# Patient Record
Sex: Female | Born: 1990 | Race: Black or African American | Hispanic: No | Smoking: Former smoker
Health system: Southern US, Community
[De-identification: ages and names within clinical notes are randomized; demographics above are authoritative.]

## PROBLEM LIST (undated history)

## (undated) DIAGNOSIS — C801 Malignant (primary) neoplasm, unspecified: Secondary | ICD-10-CM

## (undated) DIAGNOSIS — I2699 Other pulmonary embolism without acute cor pulmonale: Secondary | ICD-10-CM

## (undated) DIAGNOSIS — R002 Palpitations: Secondary | ICD-10-CM

## (undated) DIAGNOSIS — I82409 Acute embolism and thrombosis of unspecified deep veins of unspecified lower extremity: Secondary | ICD-10-CM

## (undated) DIAGNOSIS — F319 Bipolar disorder, unspecified: Secondary | ICD-10-CM

## (undated) DIAGNOSIS — K219 Gastro-esophageal reflux disease without esophagitis: Secondary | ICD-10-CM

## (undated) DIAGNOSIS — R519 Headache, unspecified: Secondary | ICD-10-CM

## (undated) DIAGNOSIS — F32A Depression, unspecified: Secondary | ICD-10-CM

## (undated) DIAGNOSIS — D649 Anemia, unspecified: Secondary | ICD-10-CM

## (undated) DIAGNOSIS — R443 Hallucinations, unspecified: Secondary | ICD-10-CM

## (undated) DIAGNOSIS — E282 Polycystic ovarian syndrome: Secondary | ICD-10-CM

## (undated) DIAGNOSIS — F419 Anxiety disorder, unspecified: Secondary | ICD-10-CM

## (undated) DIAGNOSIS — J189 Pneumonia, unspecified organism: Secondary | ICD-10-CM

## (undated) DIAGNOSIS — R55 Syncope and collapse: Secondary | ICD-10-CM

## (undated) HISTORY — PX: TONSILLECTOMY: SUR1361

## (undated) HISTORY — DX: Bipolar disorder, unspecified: F31.9

## (undated) HISTORY — PX: TONSILLECTOMY AND ADENOIDECTOMY: SHX28

## (undated) HISTORY — DX: Polycystic ovarian syndrome: E28.2

## (undated) HISTORY — PX: BARIATRIC SURGERY: SHX1103

---
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
## 2010-03-12 ENCOUNTER — Emergency Department: Admit: 2010-03-12 | Payer: Self-pay | Source: Emergency Department | Admitting: Emergency Medical Services

## 2010-03-12 LAB — BASIC METABOLIC PANEL
Anion Gap: 10 (ref 5.0–15.0)
BUN: 9 mg/dL (ref 7–21)
CO2: 26 mEq/L (ref 22–31)
Calcium: 8.9 mg/dL (ref 8.6–10.2)
Chloride: 107 mEq/L (ref 98–107)
Creatinine: 0.8 mg/dL (ref 0.5–1.4)
Glucose: 79 mg/dL (ref 70–100)
Potassium: 3.9 mEq/L (ref 3.6–5.0)
Sodium: 143 mEq/L (ref 136–143)

## 2010-03-12 LAB — CBC AND DIFFERENTIAL
Baso(Absolute): 0.02 10*3/uL (ref 0.00–0.20)
Basophils: 0 % (ref 0–2)
Eosinophils Absolute: 0.03 10*3/uL (ref 0.00–0.70)
Eosinophils: 1 % (ref 0–5)
Hematocrit: 37.2 % (ref 37.0–47.0)
Hgb: 11.9 g/dL — ABNORMAL LOW (ref 12.0–16.0)
Lymphocytes Absolute: 2.48 10*3/uL (ref 0.50–4.40)
Lymphocytes: 52 % — ABNORMAL HIGH (ref 15–41)
MCH: 25.4 pg — ABNORMAL LOW (ref 28.0–32.0)
MCHC: 32 g/dL (ref 32.0–36.0)
MCV: 79.5 fL — ABNORMAL LOW (ref 80.0–100.0)
MPV: 9.6 fL (ref 9.4–12.3)
Monocytes Absolute: 0.33 10*3/uL (ref 0.00–1.20)
Monocytes: 7 % (ref 0–11)
Neutrophils Absolute: 1.87 10*3/uL
Neutrophils: 40 % — ABNORMAL LOW (ref 52–75)
Platelets: 313 10*3/uL (ref 140–400)
RBC: 4.68 10*6/uL (ref 4.20–5.40)
RDW: 16 % — ABNORMAL HIGH (ref 12–15)
WBC: 4.73 10*3/uL (ref 3.50–10.80)

## 2010-03-12 LAB — URINALYSIS, REFLEX TO MICROSCOPIC EXAM IF INDICATED
Bilirubin, UA: NEGATIVE
Glucose, UA: NEGATIVE
Ketones UA: NEGATIVE
Leukocyte Esterase, UA: NEGATIVE
Nitrite, UA: NEGATIVE
Specific Gravity UA POCT: 1.022 (ref 1.001–1.035)
Urine pH: 7 (ref 5.0–8.0)
Urobilinogen, UA: 2 mg/dL

## 2010-03-12 LAB — HEPATIC FUNCTION PANEL
ALT: 35 U/L (ref 7–56)
AST (SGOT): 26 U/L (ref 5–40)
Albumin/Globulin Ratio: 1.1 (ref 1.1–1.8)
Albumin: 3.8 g/dL (ref 3.7–5.1)
Alkaline Phosphatase: 77 U/L (ref 43–122)
Bilirubin Direct: 0.2 mg/dL (ref 0.0–0.3)
Bilirubin Indirect: 0.1 mg/dL (ref 0.0–1.1)
Bilirubin, Total: 0.3 mg/dL (ref 0.2–1.3)
Globulin: 3.4 g/dL (ref 2.0–3.7)
Protein, Total: 7.2 g/dL (ref 6.0–8.0)

## 2010-03-12 LAB — HCG, SERUM, QUALITATIVE: Hcg Qualitative: NEGATIVE

## 2010-03-12 LAB — GFR: EGFR: >60

## 2010-03-12 LAB — LIPASE: Lipase: 86 U/L (ref 23–300)

## 2010-07-24 ENCOUNTER — Inpatient Hospital Stay
Admission: EM | Admit: 2010-07-24 | Disposition: A | Discharge status: Home / Self Care | Payer: Self-pay | Source: Emergency Department | Admitting: Pulmonary Disease

## 2010-07-24 LAB — CBC AND DIFFERENTIAL
Baso(Absolute): 0.01 10*3/uL (ref 0.00–0.20)
Basophils: 0 % (ref 0–2)
Eosinophils Absolute: 0.01 10*3/uL (ref 0.00–0.70)
Eosinophils: 0 % (ref 0–5)
Hematocrit: 38.9 % (ref 37.0–47.0)
Hgb: 12.7 g/dL (ref 12.0–16.0)
Immature Granulocytes Absolute: 0.02 10*3/uL
Immature Granulocytes: 0 % (ref 0–1)
Lymphocytes Absolute: 1.52 10*3/uL (ref 0.50–4.40)
Lymphocytes: 16 % (ref 15–41)
MCH: 25.1 pg — ABNORMAL LOW (ref 28.0–32.0)
MCHC: 32.6 g/dL (ref 32.0–36.0)
MCV: 76.9 fL — ABNORMAL LOW (ref 80.0–100.0)
MPV: 9.1 fL — ABNORMAL LOW (ref 9.4–12.3)
Monocytes Absolute: 0.49 10*3/uL (ref 0.00–1.20)
Monocytes: 5 % (ref 0–11)
Neutrophils Absolute: 7.39 10*3/uL
Neutrophils: 78 % — ABNORMAL HIGH (ref 52–75)
Platelets: 317 10*3/uL (ref 140–400)
RBC: 5.06 10*6/uL (ref 4.20–5.40)
RDW: 15 % (ref 12–15)
WBC: 9.42 10*3/uL (ref 3.50–10.80)

## 2010-07-24 LAB — BASIC METABOLIC PANEL
Anion Gap: 13 (ref 5.0–15.0)
BUN: 5 mg/dL — ABNORMAL LOW (ref 7.0–19.0)
CO2: 21 mEq/L — ABNORMAL LOW (ref 22–29)
Calcium: 9.8 mg/dL (ref 8.5–10.5)
Chloride: 102 mEq/L (ref 98–107)
Creatinine: 0.9 mg/dL (ref 0.6–1.0)
Glucose: 117 mg/dL — ABNORMAL HIGH (ref 70–100)
Potassium: 4.7 mEq/L (ref 3.5–5.1)
Sodium: 136 mEq/L (ref 136–145)

## 2010-07-24 LAB — GFR: EGFR: >60

## 2010-07-24 LAB — HEMOLYSIS INDEX: Hemolysis Index: 12 Index (ref 0–18)

## 2010-07-24 LAB — TSH: TSH: 1.44 u[IU]/mL (ref 0.35–4.94)

## 2010-07-24 LAB — TROPONIN I: Troponin I: <0.01 ng/mL (ref 0.00–0.09)

## 2010-07-24 LAB — CREATININE WHOLE BLOOD: Whole Blood Creatinine: 0.84 mg/dL (ref 0.40–1.10)

## 2010-07-25 LAB — PT/INR
PT INR: 1 (ref 0.9–1.1)
PT: 13.1 s (ref 12.6–15.0)

## 2010-07-25 LAB — BASIC METABOLIC PANEL
Anion Gap: 11 (ref 5.0–15.0)
BUN: 5 mg/dL — ABNORMAL LOW (ref 7.0–19.0)
CO2: 21 mEq/L — ABNORMAL LOW (ref 22–29)
Calcium: 8.9 mg/dL (ref 8.5–10.5)
Chloride: 102 mEq/L (ref 98–107)
Creatinine: 0.7 mg/dL (ref 0.6–1.0)
Glucose: 91 mg/dL (ref 70–100)
Potassium: 4.3 mEq/L (ref 3.5–5.1)
Sodium: 134 mEq/L — ABNORMAL LOW (ref 136–145)

## 2010-07-25 LAB — CBC AND DIFFERENTIAL
Baso(Absolute): 0.02 10*3/uL (ref 0.00–0.20)
Basophils: 0 % (ref 0–2)
Eosinophils Absolute: 0.02 10*3/uL (ref 0.00–0.70)
Eosinophils: 0 % (ref 0–5)
Hematocrit: 35.3 % — ABNORMAL LOW (ref 37.0–47.0)
Hgb: 11.2 g/dL — ABNORMAL LOW (ref 12.0–16.0)
Immature Granulocytes Absolute: 0 10*3/uL
Immature Granulocytes: 0 % (ref 0–1)
Lymphocytes Absolute: 2.3 10*3/uL (ref 0.50–4.40)
Lymphocytes: 29 % (ref 15–41)
MCH: 24.3 pg — ABNORMAL LOW (ref 28.0–32.0)
MCHC: 31.7 g/dL — ABNORMAL LOW (ref 32.0–36.0)
MCV: 76.7 fL — ABNORMAL LOW (ref 80.0–100.0)
MPV: 9.3 fL — ABNORMAL LOW (ref 9.4–12.3)
Monocytes Absolute: 0.7 10*3/uL (ref 0.00–1.20)
Monocytes: 9 % (ref 0–11)
Neutrophils Absolute: 4.98 10*3/uL
Neutrophils: 62 % (ref 52–75)
Platelets: 291 10*3/uL (ref 140–400)
RBC: 4.6 10*6/uL (ref 4.20–5.40)
RDW: 16 % — ABNORMAL HIGH (ref 12–15)
WBC: 8.02 10*3/uL (ref 3.50–10.80)

## 2010-07-25 LAB — HEMOLYSIS INDEX: Hemolysis Index: 0 Index (ref 0–18)

## 2010-07-25 LAB — HOMOCYSTEINE, SERUM: HOMOCYSTEINE: 9.17 umol/L (ref 5.08–15.39)

## 2010-07-25 LAB — FIBRINOGEN: Fibrinogen: 746 mg/dL — ABNORMAL HIGH (ref 189–458)

## 2010-07-25 LAB — GFR: EGFR: >60

## 2010-07-26 LAB — GFR: EGFR: >60

## 2010-07-26 LAB — CBC AND DIFFERENTIAL
Baso(Absolute): 0.02 10*3/uL (ref 0.00–0.20)
Basophils: 0 % (ref 0–2)
Eosinophils Absolute: 0.06 10*3/uL (ref 0.00–0.70)
Eosinophils: 1 % (ref 0–5)
Hematocrit: 34 % — ABNORMAL LOW (ref 37.0–47.0)
Hgb: 10.7 g/dL — ABNORMAL LOW (ref 12.0–16.0)
Immature Granulocytes Absolute: 0.01 10*3/uL
Immature Granulocytes: 0 % (ref 0–1)
Lymphocytes Absolute: 2.05 10*3/uL (ref 0.50–4.40)
Lymphocytes: 32 % (ref 15–41)
MCH: 24.3 pg — ABNORMAL LOW (ref 28.0–32.0)
MCHC: 31.5 g/dL — ABNORMAL LOW (ref 32.0–36.0)
MCV: 77.3 fL — ABNORMAL LOW (ref 80.0–100.0)
MPV: 9 fL — ABNORMAL LOW (ref 9.4–12.3)
Monocytes Absolute: 0.56 10*3/uL (ref 0.00–1.20)
Monocytes: 9 % (ref 0–11)
Neutrophils Absolute: 3.71 10*3/uL
Neutrophils: 58 % (ref 52–75)
Platelets: 290 10*3/uL (ref 140–400)
RBC: 4.4 10*6/uL (ref 4.20–5.40)
RDW: 15 % (ref 12–15)
WBC: 6.4 10*3/uL (ref 3.50–10.80)

## 2010-07-26 LAB — BASIC METABOLIC PANEL
Anion Gap: 9 (ref 5.0–15.0)
BUN: 6 mg/dL — ABNORMAL LOW (ref 7.0–19.0)
CO2: 23 mEq/L (ref 22–29)
Calcium: 8.8 mg/dL (ref 8.5–10.5)
Chloride: 101 mEq/L (ref 98–107)
Creatinine: 0.7 mg/dL (ref 0.6–1.0)
Glucose: 87 mg/dL (ref 70–100)
Potassium: 4.5 mEq/L (ref 3.5–5.1)
Sodium: 133 mEq/L — ABNORMAL LOW (ref 136–145)

## 2010-07-26 LAB — ANTITHROMBIN III LEVEL: Antithrombin Activity: 100 % (ref 80–120)

## 2010-07-26 LAB — HEMOLYSIS INDEX: Hemolysis Index: 3 Index (ref 0–18)

## 2010-07-27 LAB — CBC AND DIFFERENTIAL
Baso(Absolute): 0.02 10*3/uL (ref 0.00–0.20)
Basophils: 0 % (ref 0–2)
Eosinophils Absolute: 0.08 10*3/uL (ref 0.00–0.70)
Eosinophils: 1 % (ref 0–5)
Hematocrit: 32.8 % — ABNORMAL LOW (ref 37.0–47.0)
Hgb: 10.6 g/dL — ABNORMAL LOW (ref 12.0–16.0)
Immature Granulocytes Absolute: 0.02 10*3/uL
Immature Granulocytes: 0 % (ref 0–1)
Lymphocytes Absolute: 2.59 10*3/uL (ref 0.50–4.40)
Lymphocytes: 44 % — ABNORMAL HIGH (ref 15–41)
MCH: 24.5 pg — ABNORMAL LOW (ref 28.0–32.0)
MCHC: 32.3 g/dL (ref 32.0–36.0)
MCV: 75.9 fL — ABNORMAL LOW (ref 80.0–100.0)
MPV: 8.9 fL — ABNORMAL LOW (ref 9.4–12.3)
Monocytes Absolute: 0.37 10*3/uL (ref 0.00–1.20)
Monocytes: 6 % (ref 0–11)
Neutrophils Absolute: 2.88 10*3/uL
Neutrophils: 48 % — ABNORMAL LOW (ref 52–75)
Platelets: 326 10*3/uL (ref 140–400)
RBC: 4.32 10*6/uL (ref 4.20–5.40)
RDW: 15 % (ref 12–15)
WBC: 5.96 10*3/uL (ref 3.50–10.80)

## 2010-07-27 LAB — HEMOLYSIS INDEX: Hemolysis Index: 3 Index (ref 0–18)

## 2010-07-27 LAB — BASIC METABOLIC PANEL
Anion Gap: 13 (ref 5.0–15.0)
BUN: 6 mg/dL — ABNORMAL LOW (ref 7.0–19.0)
CO2: 22 mEq/L (ref 22–29)
Calcium: 9 mg/dL (ref 8.5–10.5)
Chloride: 101 mEq/L (ref 98–107)
Creatinine: 0.7 mg/dL (ref 0.6–1.0)
Glucose: 85 mg/dL (ref 70–100)
Potassium: 4.6 mEq/L (ref 3.5–5.1)
Sodium: 136 mEq/L (ref 136–145)

## 2010-07-27 LAB — PT AND APTT
PT INR: 1 (ref 0.9–1.1)
PT: 13.1 s (ref 12.6–15.0)
PTT: 33 s (ref 23–37)

## 2010-07-27 LAB — PROTEIN S AG, P: Protein S Ag, Free, P: 71 % (ref 50–160)

## 2010-07-27 LAB — PROTEIN C ACTIVITY-SOFT: Protein C Activity, P: 103 % (ref 70–150)

## 2010-07-27 LAB — GFR: EGFR: >60

## 2010-07-27 LAB — PLASMINOGEN ACTIVITY-SOFT: Plasminogen Activity, P: >150 % — ABNORMAL HIGH (ref 75–140)

## 2010-07-28 LAB — LUPUS ANTICOAGULANT
PTT-LA Screen: 38 s (ref <=40)
dRVVT Screen: 39 s (ref <=45)

## 2010-07-28 LAB — PHOSPHOLIP (CARDIOLIP) AB EVAL, S-SOFT
Phospholipid AB, IgG, S: <4 [GPL'U]
Phospholipid AB, IgM, S: <4 [MPL'U]
Phospholipid Ab IgA, S: <4 [APL'U]

## 2010-07-31 LAB — FACTOR V LEIDEN (R506Q) MUTATION, B: Factor V Leiden Mutation Result: NEGATIVE

## 2010-07-31 LAB — PROTHROMBIN G20210A MUTATION, B - SOFT: Prothrombin 20210 Mutation Result: NEGATIVE

## 2010-11-22 ENCOUNTER — Ambulatory Visit
Admit: 2010-11-22 | Disposition: A | Discharge status: Home / Self Care | Payer: Self-pay | Source: Ambulatory Visit | Admitting: Adult Health

## 2010-11-27 ENCOUNTER — Ambulatory Visit
Admit: 2010-11-27 | Disposition: A | Discharge status: Home / Self Care | Payer: Self-pay | Source: Ambulatory Visit | Admitting: Hematology & Oncology

## 2011-07-18 LAB — ECG 12-LEAD
Atrial Rate: 138 {beats}/min
P Axis: 62 degrees
P-R Interval: 148 ms
Q-T Interval: 262 ms
QRS Duration: 78 ms
QTC Calculation (Bezet): 396 ms
R Axis: 64 degrees
T Axis: 19 degrees
Ventricular Rate: 138 {beats}/min

## 2011-08-17 NOTE — H&P (Signed)
Brandy Allen, Brandy Allen      MRN:          [id number]      Account:      [id number]      Document ID:  [id number]                  Admit Date: 07/24/2010            Patient Location: A2535-A      Patient Type: I            ATTENDING PHYSICIAN: Jackquline Berlin, MD                  HISTORY OF PRESENT ILLNESS:      This is a 20 year old female, history of polycystic ovary syndrome,      presented to the emergency room at [facility] with chief      complaint of shortness of breath and chest pain.  The patient has not been      feeling well for about 2 weeks with increasing dyspnea on exertion.  She      started school.  Every time she goes up the stairs she feels very      short-winded.  She is almost about to pass out.  She also felt increasing      dyspnea walking from her car to the school which is a short distance.  She      developed some increasing left calf pain along with pain in the back on      deep breathing for the last 3 days.  Seen in emergency room, she was      tachycardic rate 130.  She was tachypneic, had a low-grade temperature,      otherwise her blood pressure was stable.  Initial workup in the emergency      room including CT of the chest showed evidence of pulmonary embolism with      atelectasis the left lower lobe.  Venous Doppler lower extremity confirmed      left lower extremity DVT.  Admitted with diagnosis of DVT, pulmonary      embolism for further management. Low-grade temperature, pain with deep      breathing and left calf pain along with increasing dyspnea _____ these      symptoms.  No nausea, vomiting, but severe pleurisy with deep breathing,      lower back pain also was reported which he has had for over a long period      of time which she related to her polycystic ovary issue.            PAST MEDICAL HISTORY:      Only polycystic ovary.            PAST SURGICAL HISTORY:      Tonsillectomy.            SOCIAL HISTORY:      She smoked tobacco, but she quit within the last  year.  No alcohol use, no      drug use.            FAMILY HISTORY:      Noncontributory for any blood clotting or any miscarriages, but possible      underlying cardiac disease in the family.            MEDICATIONS:      At home she is Loestrin, which she takes once a day.  Page 1 of 3      Brandy Allen, Brandy Allen      MRN:          [id number]      Account:      [id number]      Document ID:  [id number]                        ALLERGIES:      She denies any drug allergies.            PHYSICAL EXAMINATION:      GENERAL:  Seen at bedside, awake and alert, feeling some better, still with      significant pain in the back and the calf.      VITAL SIGNS:  Blood pressure 130/80, heart rate about 108, goes up to 150      when she is ambulating, respiration 14, saturation 100% on 2 L, temperature      97.7.      HEAD AND  NECK:  Supple, no adenopathy.      CARDIAC:  Regular rate and rhythm, sinus tachycardia.  No gallop, no      murmur.  Her EKG was not done at time of this dictation.      LUNGS:  Diminished breath sounds at the bases, few crackles at the left      base.  CT of the chest done showed pulmonary embolism involving the left      lower lobe and subsegmental pulmonary artery as well as left lower lobe      subsegmental pulmonary artery.  There is also left  lower lobe opacity      which could be related to atelectasis with small effusion.      EXTREMITIES:  Lower extremities showed some calf tenderness in the left      with some mild swelling noted.  The venous Doppler of the left lower      extremity showed acute occlusive deep venous thrombosis of the left      popliteal and peroneal vein.            LABORATORY DATA:      WBC is 9, hematocrit 38, platelet count 317.  Sodium 136, potassium is 4.7,      chloride of 102, bicarbonate is 21, BUN 5, creatinine 0.9, glucose is 117,      TSH 1.4.            ASSESSMENT AND PLAN:      This is a 20 year old female with above medical history, now  with acute      deep venous thrombosis, pulmonary embolism first episode.  Risk factors      including obesity and contraception.  She also has polycystic ovaries.  At      this time, Lovenox converting to Coumadin.  Hypercoagulable workup.  Pain      control, intravenous hydration, echocardiogram along with EKG to complete      the workup.  Hematology consultation will be obtained.  Coumadin indication      will be initiated.  If stable, will be discharged in 24 hours.  Plan of      care discussed with the patient and the patient's mother at the bedside.                        Electronic Signing Provider            D:  07/25/2010 13:17 PM by Dr. Jordan Likes A. Eston Mould, MD [phone])      T:  07/25/2010 13:49 PM by [email or web address]                                         Page 2 of 3      Brandy Allen, Brandy Allen      MRN:          [id number]      Account:      [id number]      Document ID:  [id number]                        cc:                                   Page 3 of 3      Authenticated by Bayard Hugger Khouri [phone]) On 07/31/2010 12:23:22 AM

## 2011-08-17 NOTE — Consults (Signed)
Brandy Allen, Brandy Allen      MRN:          [id number]      Account:      [id number]      Document ID:  [id number]      Service Date: 07/27/2010            Admit Date: 07/24/2010            Patient Location: DISCHARGED 07/27/2010      Patient Type: I            CONSULTING PHYSICIAN: Sharmon Revere MD            REFERRING PHYSICIAN: Margarite Gouge MD                  REASON FOR CONSULTATION:      Deep venous thrombosis, pulmonary embolism.            HISTORY OF PRESENT ILLNESS:      The patient is a pleasant 20 year old African American woman with      polycystic ovarian disease.  She has severe cramping and menorrhagia.      Sometime in April, she was started on a birth control pill.  This was      switched to another brand in August of this year.  About a week ago, the      patient developed left calf tenderness and cramping with walking.  There      was also some accompanying swelling.  Along with these symptoms, she      remembers feeling pain in the left side of her chest, especially when      taking a deep breath or coughing.  Because of the lack of resolution of the      symptoms, she presents to [facility] for further evaluation.            In the emergency room, a Doppler ultrasound of the lower extremity showed      an acute deep vein thrombosis involving the left popliteal vein.  The      D-dimer was high.  A followup CT scan of the chest showed a left-sided      lower lobe pulmonary embolism.  The patient was started on a blood thinner,      including Lovenox and Coumadin.  Hypercoagulable workup has been ordered      and is pending.  We are asked to evaluate the patient and help in the      management.            The patient denies any previous history of thromboembolism.  She has never      been pregnant.  She has a sister who has never had miscarriages or a      thromboembolic event.  There is no recent history of weight gain or weight      loss.  There is no history of traveling.   There is no history of trauma.      The patient has discontinued estrogen therapy since her diagnosis of DVT      and pulmonary embolism.            PAST MEDICAL HISTORY:      Polycystic ovarian disease, menorrhagia, obesity.            MEDICATIONS:      Coumadin and Lovenox.  Page 1 of 3      VAUGHN, FRIEZE      MRN:          [id number]      Account:      [id number]      Document ID:  [id number]      Service Date: 07/27/2010            ALLERGIES:      NKDA.            SOCIAL HISTORY:      The patient is single.  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  She does not smoke tobacco and does not drink alcohol.            FAMILY HISTORY:      Negative for cancer, blood cell dyscrasia as noted above.            REVIEW OF SYSTEMS:      A complete 12-point review of systems was done and was negative in detail,      except for what is noted above in the history of present illness.            PHYSICAL EXAMINATION:      GENERAL:  Pleasant, obese, in no acute distress, afebrile.      VITAL SIGNS:  Heart rate 80, respiratory rate 14, blood pressure 130/70.      HEENT:  Normocephalic, sclerae anicteric, oral mucosa moist.      NECK:  Supple, no JVD.      LUNGS:  Clear bilaterally.      HEART:  S1, S2 regular, no murmur.      ABDOMEN:  Soft, no hepatosplenomegaly, guarding, rebound.      EXTREMITIES:  No edema, clubbing, or cyanosis.      NEUROLOGIC:  Grossly nonfocal.  Affect appropriate.      SKIN:  Normal turgor, no bruising or petechia.      PSYCHIATRIC:  Affect appropriate.            LABORATORY DATA:      WBC 5.96; hematocrit 32.8; MCV 75.9; platelet count 326,000; white cell      differential unremarkable.  Complete metabolic panel is unremarkable,      except for a BUN of 6.  The INR is 1 and APTT 33.  Fibrinogen was 746.      Plasminogen activator, in fact, was greater than 150.  Protein C activity      103, protein S antigen-free 71.  Pending results include  Factor V Leiden,      lupus anticoagulant, cardiolipin antibody.            ASSESSMENT AND PLAN:      1.  Lower extremity deep vein thrombosis and pulmonary embolism in a      patient who has been taking estrogen therapy.  Other risk factors include      her body habitus.  I recommend anticoagulation for 6 months.  The patient      will be bridged to Coumadin with Lovenox.  A complete thrombophilic workup      will be done once off Coumadin.  The patient is asked to wear compression      stockings to the left leg that goes all the way up to the thigh.  She is      also encouraged to lose weight.      2.  Polycystic ovarian disease.  This is likely to represent a problem in the      future for this woman who already has menorrhagia while not on a blood      thinner.  Recommendation will follow and may include placement                                   Page 2 of 3      LAPORSCHA, LINEHAN      MRN:          [id number]      Account:      [id number]      Document ID:  [id number]      Service Date: 07/27/2010            of a temporary inferior vena cava filter, recommendation for dilation and      curretage or uterine artery embolization. 3.  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  The patient was given prescription to start      taking iron sulfate 325 mg p.o. t.i.d.  We will follow her blood count as an      outpatient.            Thank you, Dr. Eston Mould, for having [place] participate in the care of this      pleasant woman.  I will arrange for her to get an INR checked with our      Coumadin clinic on October, 2, 2011.                        Electronic Signing Provider            D:  07/27/2010 15:52 PM by Dr. Sharmon Revere, MD ([id number])      T:  07/27/2010 16:32 PM by [email or web address]                  cc:     Sharmon Revere MD           Jackquline Berlin MD                                   Page 3 of 3      Authenticated and Edited by Sharmon Revere, MD On 07/27/10 9:32:33 PM

## 2011-08-17 NOTE — Discharge Summary (Signed)
Brandy Allen, Brandy Allen      MRN:          [id number]      Account:      [id number]      Document ID:  [id number]                  Admit Date: 07/24/2010      Discharge Date: 07/27/2010            ATTENDING PHYSICIAN:  Jackquline Berlin, MD                  HISTORY OF PRESENT ILLNESS:      A 20 year old female, history polycystic ovary syndrome, admitted to [facility] with left calf pain, chest pain, and shortness of      breath.  Please refer to history and physical date of presentation.  The      patient was diagnosed with pulmonary embolism on CAT scan of the chest      along with DVT of the left lower extremity.  She was hemodynamically      stable, but she had some episode of tachycardia and received IV fluid and      started on anticoagulation with Lovenox and Coumadin.  Echocardiogram was      done, showed ejection fraction of 60% and no evidence of RV strain.      Overall, did well.  Her pain subsided.  She had some infiltrate on her      chest x-ray, which I believe this could be related to atelectasis versus an      infarct.  She had no hemoptysis and no other associated symptoms.  She was      placed on incentive spirometer, pain control and overall did well.  Her INR      is still subtherapeutic.  She will be going home on Lovenox and Coumadin.      I will put her on 7 mg daily.  She is to follow up with hematology-oncology      as an outpatient.            DISCHARGE DIAGNOSES:      1.  Pulmonary embolism, deep venous thrombosis, first episode, etiology      possibly related to be on hormonal contraception and being overweight.      2.  History of polycystic ovary.  She is to be off her hormonal therapy at      this time and follow up with her gynecologist within 1 to 2 weeks.            DISCHARGE MEDICATIONS:      As per medication reconciliation.                        Electronic Signing Provider            D:  07/27/2010 11:26 AM by Dr. Jordan Likes A. Eston Mould, MD [phone])      T:  07/27/2010  14:05 PM by [id number]                        cc:                                   Page 1 of 1      Authenticated by Bayard Hugger  Khouri ([id number]) On 07/31/2010 12:23:24 AM

## 2011-08-17 NOTE — Procedures (Signed)
Brandy Allen, Brandy Allen      MRN:          [id number]      Account:      [id number]      Document ID:  [id number]      Service Date: 07/25/2010            Admit Date: 07/24/2010      Order Number:            Patient Location: A2535-A      Patient Type: I            PHYSICIAN/PROVIDER: Rolan Lipa MD            REFERRING PHYSICIAN: Margarite Gouge MD                  INDICATION:      Patient is a 20 year old lady with obesity, is hospitalized secondary to a      pulmonary embolism.            M-MODE 2-DIMENSIONAL MEASUREMENTS:      LEFT ATRIUM:  38.      AORTIC ROOT:  31.      AORTIC LEAFLET SEPARATION:  19.      RV DIASTOLIC:  21.      LV SYSTOLIC:  34.      LV DIASTOLIC:  52.      IVS DIASTOLIC:  10.      LVPW DIASTOLIC:  10.      FRACTIONAL SHORTENING:  35%.      ESTIMATED EJECTION FRACTION:  60%.      PERICARDIAL EFFUSION:  Small, posterior.            DESCRIPTION OF FINDINGS:      Combined M-mode and 2-D studies are reviewed.  Cardiac chamber sizes and      aortic root size are normal.  Right ventricular wall thickness, motion, and      systolic function are normal.  Left ventricular wall thickness is near the      upper limit of normal.  Left ventricular contractility is normal with      estimated ejection fraction being 60%.            Aortic valve is trileaflet and appears normal.  There is no evidence of any      significant intrinsic abnormality of mitral, tricuspid, or pulmonic valve.            There is no evidence of mass or thrombus in the visualized portions of the      cardiac chambers.  An echo-free space is seen posteriorly, likely      representing a small, hemodynamically insignificant, pericardial effusion.      It seems unlikely to be pericardial fat.            DOPPLER MEASUREMENTS:      PEAK VELOCITIES:                                   Page 1 of 2      Brandy Allen, Brandy Allen      MRN:          [id number]      Account:      [id number]      Document ID:  [id number]      Service Date: 07/25/2010             MITRAL:  1.0.      AORTIC:  1.6.      PULMONIC:  1.2.      TRICUSPID:  0.9.      LVOT:  1.4.            DEGREE OF VALVULAR REGURGITATION:      MITRAL:  Trace.      TRICUSPID:  Trace.            DESCRIPTION OF FINDINGS:      Color flow, pulse wave, and continuous wave Doppler studies are reviewed.      There is evidence of trace mitral and trace tricuspid regurgitations.      Estimated RV systolic pressure is 33 mmHg.  LV inflow Doppler across the      mitral valve shows fused E and A waves secondary to sinus tachycardia.      There is no suggestion of intracardiac shunt.            IMPRESSION:      1.  Normal cardiac dimensions.      2.  Normal right ventricular dimension, wall motion, and function.      3.  Normal left ventricular wall motion and systolic function with      estimated ejection fraction being 60%.      4.  No evidence of significant valvular abnormality.      5.  There is no evidence of intracardiac mass or thrombus.      6.  Small, hemodynamically insignificant, posterior pericardial effusion.      7.  Trace mitral and trace tricuspid regurgitations with no suggestion of      significant pulmonary hypertension based upon Doppler calculations.                        Electronic Signing Provider            D:  07/25/2010 23:07 PM by Dr. Rolan Lipa, MD [phone])      T:  07/26/2010 09:39 AM by [email or web address]                  XT:GGYIRSWN Karie Mainland MD      Jackquline Berlin MD                                   Page 2 of 2      Authenticated by Rolan Lipa, MD [phone]) On 08/20/2010 10:39:11 AM

## 2012-10-23 ENCOUNTER — Emergency Department
Admission: EM | Admit: 2012-10-23 | Discharge: 2012-10-23 | Disposition: A | Discharge status: Home / Self Care | Payer: Commercial Managed Care - POS | Attending: Emergency Medicine | Admitting: Emergency Medicine

## 2012-10-23 ENCOUNTER — Emergency Department: Payer: Commercial Managed Care - POS

## 2012-10-23 DIAGNOSIS — F17200 Nicotine dependence, unspecified, uncomplicated: Secondary | ICD-10-CM | POA: Insufficient documentation

## 2012-10-23 DIAGNOSIS — K2970 Gastritis, unspecified, without bleeding: Secondary | ICD-10-CM

## 2012-10-23 DIAGNOSIS — E282 Polycystic ovarian syndrome: Secondary | ICD-10-CM | POA: Insufficient documentation

## 2012-10-23 DIAGNOSIS — K2990 Gastroduodenitis, unspecified, without bleeding: Secondary | ICD-10-CM | POA: Insufficient documentation

## 2012-10-23 DIAGNOSIS — R109 Unspecified abdominal pain: Secondary | ICD-10-CM | POA: Insufficient documentation

## 2012-10-23 HISTORY — DX: Polycystic ovarian syndrome: E28.2

## 2012-10-23 LAB — CBC AND DIFFERENTIAL
Basophils Absolute Automated: 0.03 10*3/uL (ref 0.00–0.20)
Basophils Automated: 0 % (ref 0–2)
Eosinophils Absolute Automated: 0.1 10*3/uL (ref 0.00–0.70)
Eosinophils Automated: 2 % (ref 0–5)
Hematocrit: 37.2 % (ref 37.0–47.0)
Hgb: 11.7 g/dL — ABNORMAL LOW (ref 12.0–16.0)
Immature Granulocytes Absolute: 0.01 10*3/uL
Immature Granulocytes: 0 % (ref 0–1)
Lymphocytes Absolute Automated: 2.64 10*3/uL (ref 0.50–4.40)
Lymphocytes Automated: 47 % — ABNORMAL HIGH (ref 15–41)
MCH: 25.1 pg — ABNORMAL LOW (ref 28.0–32.0)
MCHC: 31.5 g/dL — ABNORMAL LOW (ref 32.0–36.0)
MCV: 79.7 fL — ABNORMAL LOW (ref 80.0–100.0)
MPV: 9.1 fL — ABNORMAL LOW (ref 9.4–12.3)
Monocytes Absolute Automated: 0.35 10*3/uL (ref 0.00–1.20)
Monocytes: 6 % (ref 0–11)
Neutrophils Absolute: 2.49 10*3/uL (ref 1.80–8.10)
Neutrophils: 44 % — ABNORMAL LOW (ref 52–75)
Nucleated RBC: 0 /100 WBC (ref 0–1)
Platelets: 304 10*3/uL (ref 140–400)
RBC: 4.67 10*6/uL (ref 4.20–5.40)
RDW: 16 % — ABNORMAL HIGH (ref 12–15)
WBC: 5.61 10*3/uL (ref 3.50–10.80)

## 2012-10-23 LAB — URINALYSIS, REFLEX TO MICROSCOPIC EXAM IF INDICATED
Bilirubin, UA: NEGATIVE
Glucose, UA: NEGATIVE
Ketones UA: NEGATIVE
Leukocyte Esterase, UA: NEGATIVE
Nitrite, UA: NEGATIVE
Protein, UR: NEGATIVE
Specific Gravity UA: 1.018 (ref 1.001–1.035)
Urine pH: 6 (ref 5.0–8.0)
Urobilinogen, UA: NEGATIVE mg/dL

## 2012-10-23 LAB — COMPREHENSIVE METABOLIC PANEL
ALT: 28 U/L (ref 0–55)
AST (SGOT): 20 U/L (ref 5–34)
Albumin/Globulin Ratio: 0.9 (ref 0.9–2.2)
Albumin: 3.3 g/dL — ABNORMAL LOW (ref 3.5–5.0)
Alkaline Phosphatase: 75 U/L (ref 40–150)
Anion Gap: 9 (ref 5.0–15.0)
BUN: 8 mg/dL (ref 7.0–19.0)
Bilirubin, Total: 0.3 mg/dL (ref 0.2–1.2)
CO2: 23 mEq/L (ref 22–29)
Calcium: 9.4 mg/dL (ref 8.5–10.5)
Chloride: 107 mEq/L (ref 98–107)
Creatinine: 0.8 mg/dL (ref 0.6–1.0)
Globulin: 3.8 g/dL — ABNORMAL HIGH (ref 2.0–3.6)
Glucose: 88 mg/dL (ref 70–100)
Potassium: 4.3 mEq/L (ref 3.5–5.1)
Protein, Total: 7.1 g/dL (ref 6.0–8.3)
Sodium: 139 mEq/L (ref 136–145)

## 2012-10-23 LAB — HEMOLYSIS INDEX: Hemolysis Index: 20 Index — ABNORMAL HIGH (ref 0–18)

## 2012-10-23 LAB — POCT PREGNANCY TEST, URINE HCG: POCT Pregnancy HCG Test, UR: NEGATIVE

## 2012-10-23 LAB — GFR: EGFR: >60

## 2012-10-23 LAB — LIPASE: Lipase: 44 U/L (ref 8–78)

## 2012-10-23 LAB — CARBOXYHEMOGLOBIN: Carboxyhemoglobin: 0.8 % (ref 0.0–3.0)

## 2012-10-23 MED ORDER — SODIUM CHLORIDE 0.9 % IV BOLUS
1000.00 mL | Freq: Once | INTRAVENOUS | Status: AC
Start: 2012-10-23 — End: 2012-10-23
  Administered 2012-10-23: 1000 mL via INTRAVENOUS

## 2012-10-23 MED ORDER — KETOROLAC TROMETHAMINE 30 MG/ML IJ SOLN
30.00 mg | Freq: Once | INTRAMUSCULAR | Status: AC
Start: 2012-10-23 — End: 2012-10-23
  Administered 2012-10-23: 30 mg via INTRAVENOUS
  Filled 2012-10-23: qty 1

## 2012-10-23 MED ORDER — PANTOPRAZOLE SODIUM 40 MG PO TBEC
40.00 mg | DELAYED_RELEASE_TABLET | Freq: Every day | ORAL | Status: AC
Start: 2012-10-23 — End: 2012-11-22

## 2012-10-23 MED ORDER — ONDANSETRON HCL 4 MG/2ML IJ SOLN
4.00 mg | Freq: Once | INTRAMUSCULAR | Status: AC
Start: 2012-10-23 — End: 2012-10-23
  Administered 2012-10-23: 4 mg via INTRAVENOUS
  Filled 2012-10-23: qty 2

## 2012-10-23 MED ORDER — PANTOPRAZOLE SODIUM 40 MG IV SOLR
40.00 mg | Freq: Once | INTRAVENOUS | Status: AC
Start: 2012-10-23 — End: 2012-10-23
  Administered 2012-10-23: 40 mg via INTRAVENOUS
  Filled 2012-10-23: qty 40

## 2012-10-23 NOTE — Discharge Instructions (Signed)
Please follow up with your primary doctor, or with your ob/gyn doctor.  Please return if you have any other problems or concerns.   We have listed for you a few ob/gyn doctors here and also a GI doctor. Please begin taking protonix, decrease your use of NSAIDS (alleve and motrin).  ___________  Thank you for choosing [facility] for your emergency care needs.   We strive to provide EXCELLENT care to you and your family.     IF YOU DO NOT CONTINUE TO IMPROVE OR YOUR CONDITION WORSENS, PLEASE CONTACT YOUR DOCTOR OR RETURN IMMEDIATELY TO THE EMERGENCY DEPARTMENT.     DOCTOR REFERRALS   Call [phone] if you need any further referrals and we can help you find a primary care doctor or specialist. Also, available online at: [email or web address]     FREE HEALTH SERVICES   If you need help with health or social services, please call 2-1-1 for a free referral to resources in your area. 2-1-1 is a free service connecting people with information on health insurance, free clinics, pregnancy, mental health, dental care, food assistance, housing, and substance abuse counseling. Also, available online at: http://www.[facility].org     MEDICAL RECORDS AND TESTS   Certain laboratory test results do not come back the same day, for example urine cultures. We will contact you if other important findings are noted. Radiology films are often reviewed again to ensure accuracy. If there is any discrepancy, we will notify you.     ORTHOPEDIC INJURY   Please know that significant injuries can exist even when an initial x-ray is read as normal or negative. This can occur because some fractures (broken bones) are not initially visible on x-rays. For this reason, close outpatient follow-up with your primary care doctor or bone specialist (orthopedist) is required.     MEDICATIONS AND FOLLOWUP   Please be aware that some prescription medications can cause drowsiness. Use caution when driving or operating  machinery.   The examination and treatment you have received in our Emergency Department is provided on an emergency basis, and is not intended to be a substitute for your primary care physician. It is important that your doctor checks you again and that you report any new or remaining problems at that time.

## 2012-10-23 NOTE — ED Notes (Signed)
C/o lower abd pain, hx of pcos, c/o dizziness and nausea, denies vomiting, symptoms started one month ago

## 2012-10-23 NOTE — ED Provider Notes (Signed)
EMERGENCY DEPARTMENT HISTORY AND PHYSICAL EXAM     Physician/Midlevel provider first contact with patient: 10/23/12 1948         Date: 10/23/2012  Patient Name: Brandy Allen    History of Presenting Illness     Chief Complaint   Patient presents with   . Headache   . Abdominal Pain   . Nausea       History Provided By: Pt    Chief Complaint: HA  Onset: 1 month ago  Timing:   Location: head  Quality:   Severity: mild  Modifying Factors:   Associated Symptoms: lower abd pain, nausea    Additional History: Brandy Allen is a 21 y.o. female c/o a mild HA since 1 month ago. Pt reports lower abd pain and nausea as well. Hx PCOS. Pt states she has a leak in her gas cylinder and that the fumes from her car may be bothering her when she drives. Pt has a sonogram from March of 2012 showing a small cyst in the left ovary. Occasional tobacco use. NKDA. Denies V/D, pregnancy, asthma, or urinary complaints.    PCP: Pcp, Noneorunknown, MD      Current Facility-Administered Medications   Medication Dose Route Frequency Provider Last Rate Last Dose   . [COMPLETED] ketorolac (TORADOL) injection 30 mg  30 mg Intravenous Once Marlana Latus, MD   30 mg at 10/23/12 2012   . [COMPLETED] ondansetron (ZOFRAN) injection 4 mg  4 mg Intravenous Once Marlana Latus, MD   4 mg at 10/23/12 2012   . [COMPLETED] pantoprazole (PROTONIX) injection 40 mg  40 mg Intravenous Once Marlana Latus, MD   40 mg at 10/23/12 2122   . [COMPLETED] sodium chloride 0.9 % bolus 1,000 mL  1,000 mL Intravenous Once Marlana Latus, MD   1,000 mL at 10/23/12 2012     Current Outpatient Prescriptions   Medication Sig Dispense Refill   . norethindrone (MICRONOR) 0.35 MG tablet Take 1 tablet by mouth daily.       . pantoprazole (PROTONIX) 40 MG tablet Take 1 tablet (40 mg total) by mouth daily.  30 tablet  0       Past History     Past Medical History:  Past Medical History   Diagnosis Date   . PCOS (polycystic ovarian syndrome)        Past Surgical History:  Past  Surgical History   Procedure Date   . Tonsillectomy        Family History:  No family history on file.    Social History:  History   Substance Use Topics   . Smoking status: Current Some Day Smoker     Types: Cigarettes   . Smokeless tobacco: Not on file   . Alcohol Use: Yes       Allergies:  No Known Allergies    Review of Systems     Review of Systems   Constitutional: Negative for fever and chills.        No pregnancy   HENT: Negative for congestion.    Respiratory: Negative for cough.         No asthma   Cardiovascular: Negative for chest pain.   Gastrointestinal: Positive for nausea and abdominal pain (lower abd). Negative for vomiting, diarrhea, constipation and blood in stool.   Genitourinary: Negative for dysuria, urgency, frequency and hematuria.        No urinary complaints  On her menses now     Musculoskeletal: Negative for back  pain.   Neurological: Positive for headaches. Negative for dizziness and loss of consciousness.   Endo/Heme/Allergies:        NKDA   Psychiatric/Behavioral:        Occasional tobacco use         Physical Exam   BP 132/74  Pulse 94  Temp 98.1 F (36.7 C)  Resp 18  Ht 1.803 m  Wt 167.831 kg  BMI 51.63 kg/m2  SpO2 98%  LMP 10/22/2012    Physical Exam    CONSTITUTIONAL   Vital signs reviewed, Well appearing, Patient appears comfortable. Elevated BMI  HEAD   Atraumatic. Normocephalic.  EYES   Eyes are normal to inspection, No discharge from eyes, Sclera are normal.  ENT   Posterior pharynx normal. Mouth normal to inspection. MMM  NECK   Normal ROM, No jugular venous distention, no tenderness, no meningeal signs.  RESPIRATORY CHEST  Chest is nontender. Breath sounds normal, No respiratory distress.  CARDIOVASCULAR   RRR, No murmurs.  ABDOMEN   Abdomen is nontender, normal bowel sounds, No distension, No peritoneal signs.  BACK   There is no CVA Tenderness, There is no tenderness to palpation.  NEURO   Alert and oriented, appropriate. No focal motor deficits. No focal sensory  deficits. Speech normal.  SKIN   Skin is warm, Skin is dry, Skin Is normal color. Good turgor  EXTREMITIES no c/c/e, no calf tenderness, negative homans            Diagnostic Study Results     Labs -     Results     Procedure Component Value Units Date/Time    Comprehensive Metabolic Panel (CMP) [[id number]]  (Abnormal) Collected:10/23/12 2011    Specimen Information:Blood Updated:10/23/12 2038     Glucose 88 mg/dL      BUN 8.0 mg/dL      Creatinine 0.8 mg/dL      Sodium 413 mEq/L      Potassium 4.3 mEq/L      Chloride 107 mEq/L      CO2 23 mEq/L      Calcium 9.4 mg/dL      Protein, Total 7.1 g/dL      Albumin 3.3 (L) g/dL      AST (SGOT) 20 U/L      ALT 28 U/L      Alkaline Phosphatase 75 U/L      Bilirubin, Total 0.3 mg/dL      Globulin 3.8 (H) g/dL      Albumin/Globulin Ratio 0.9      Anion Gap 9.0     Lipase [[id number]] Collected:10/23/12 2011    Specimen Information:Blood Updated:10/23/12 2038     Lipase 44 U/L     HEMOLYZED INDEX [[id number]]  (Abnormal) Collected:10/23/12 2011     Hemolyzed Index 20 (H) Index Updated:10/23/12 2038    GFR [[id number]] Collected:10/23/12 2011     EGFR >60.0 Updated:10/23/12 2038    CBC and differential [[id number]]  (Abnormal) Collected:10/23/12 2012    Specimen Information:Blood / Blood Updated:10/23/12 2024     WBC 5.61 x10 3/uL      RBC 4.67 x10 6/uL      Hgb 11.7 (L) g/dL      Hematocrit 18.8 %      MCV 79.7 (L) fL      MCH 25.1 (L) pg      MCHC 31.5 (L) g/dL      RDW 16 (H) %      Platelets 304 x10 3/uL  MPV 9.1 (L) fL      Neutrophils 44 (L) %      Lymphocytes Automated 47 (H) %      Monocytes 6 %      Eosinophils Automated 2 %      Basophils Automated 0 %      Immature Granulocyte 0 %      Nucleated RBC 0 /100 WBC      Neutrophils Absolute 2.49 x10 3/uL      Abs Lymph Automated 2.64 x10 3/uL      Abs Mono Automated 0.35 x10 3/uL      Abs Eos Automated 0.10 x10 3/uL      Absolute Baso Automated 0.03 x10 3/uL      Absolute Immature Granulocyte 0.01 x10 3/uL      Carboxyhemoglobin [[id number]] Collected:10/23/12 2014    Specimen Information:Blood Updated:10/23/12 2014     Carboxyhemoglobin 0.8 %     UA, Reflex to Microscopic [[id number]]  (Abnormal) Collected:10/23/12 1942    Specimen Information:Urine Updated:10/23/12 1956     Urine Type Clean Catch      Color, UA Yellow      Clarity, UA Clear      Specific Gravity UA 1.018      Urine pH 6.0      Leukocytes, UA Negative      Nitrite, UA Negative      Protein, UA Negative      Glucose, UA Negative      Ketones UA Negative      Urobilinogen, UA Negative mg/dL      Bilirubin, UA Negative      Blood, UA Moderate (A)      RBC, UA 11 - 25 (A) /HPF      WBC, UA 0 - 5 /HPF      Squamous Epithelial Cells, Urine 0 - 5 /HPF      Urine Bacteria Rare /HPF     Urine HCG POC [[id number]] Collected:10/23/12 1943     POCT QC Pass Updated:10/23/12 1943     POCT Pregnancy HCG Test, UR Negative      Comment:        Result:     Negative Value is Normal in Healthy Males or Healthy non-pregnant Females          Radiologic Studies -   Radiology Results (24 Hour)     Procedure Component Value Units Date/Time    US Abdomen/Pelvis Art/Vein Visceral Duplex Dopp Comp [[id number]] Collected:10/23/12 2042    Order Status:Completed  Updated:10/23/12 2054    Narrative:    Indication: Pelvic pain.     Interpretation: Transvaginal ultrasound was performed.     The uterus measures 7.8 x 4.0 x 5.1 cm in size for an estimated volume  of 86 cc. No focal uterine masses are present. The endometrium is  homogeneous and measures 2 mm in thickness.     The left ovary measures 2.8 x 1.1 x 1.0 cm. The right ovary measures 3.8  x 1.7 x 1.1 cm. Both ovaries are normal in appearance.     Color and pulse wave Doppler interrogation of both ovaries demonstrates  normal internal vascularity.     There are no adnexal masses. There is no free intraperitoneal fluid.       Impression:      1. Normal pelvic ultrasound. Specifically, there is no evidence of  ovarian torsion.    US  Transvaginal Only Non-OB [[id number]] Collected:10/23/12 2042    Order Status:Completed  Updated:10/23/12  2054    Narrative:    Indication: Pelvic pain.     Interpretation: Transvaginal ultrasound was performed.     The uterus measures 7.8 x 4.0 x 5.1 cm in size for an estimated volume  of 86 cc. No focal uterine masses are present. The endometrium is  homogeneous and measures 2 mm in thickness.     The left ovary measures 2.8 x 1.1 x 1.0 cm. The right ovary measures 3.8  x 1.7 x 1.1 cm. Both ovaries are normal in appearance.     Color and pulse wave Doppler interrogation of both ovaries demonstrates  normal internal vascularity.     There are no adnexal masses. There is no free intraperitoneal fluid.       Impression:      1. Normal pelvic ultrasound. Specifically, there is no evidence of  ovarian torsion.      .      Medical Decision Making   I am the first provider for this patient.    I reviewed the vital signs, available nursing notes, past medical history, past surgical history, family history and social history.    Vital Signs-Reviewed the patient's vital signs.     Patient Vitals for the past 12 hrs:   BP Temp Pulse Resp   10/23/12 2204 132/74 mmHg - 94  18    10/23/12 1925 139/75 mmHg 98.1 F (36.7 C) 71  18        Pulse Oximetry Analysis - Normal 100% on RA    Old Medical Records: Nursing notes.   sono from 12/2010 brought with pt with left ov cyst, recommended 10 week fu sono but pt has not had repeat imaging.     ED Course:   9:00 PM - pain improved, nausea still present  9:19 PM -  The pt feels improved, states their abdominal pain is also much improved.   Their abdomen is soft without focal tenderness. We discussed gastritis and she disclosed that she has been taking numerous NSAIDs for pain (naprosyn and ibuprofen).  We also again talked about the importance of HPV and sono fu and to have her car looked at for leaks.   I have discussed the patients labs and imaging studies as well as the importance of  follow up with their primary doctor (referrals given to ob an dgi) within one to two days as well as strict return precautions, including increasing pain, fever, vomiting or any other problems or concerns.  The pt verbalized understanding and agrees.  She feels well at discharge      Provider Notes:   This is a 21 yo F with h/o PCOS who several complaints over the course of about 3 to 4 weeks, headache, nausea and lower abd pain - has not seen her pmd or ob, lives in [place] from 12/2010 showing cyst, pt has not had fu.    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     Procedures:    Core Measures: Pregnancy (HCG) test negative.    Critical Care Time:     Diagnosis     Clinical Impression:   1. Gastritis    2. Abdominal pain        _______________________________    Attestations:  This note is prepared by Zack Seal, acting as Scribe for Marlana Latus, MD.    Marlana Latus, MD. The scribe's documentation has been prepared under my direction and personally reviewed by me in its entirety.  I confirm that the note above accurately reflects all work, treatment, procedures, and medical decision making performed by me.          _______________________________          Marlana Latus, MD  10/23/12 [phone]

## 2013-08-08 ENCOUNTER — Emergency Department: Payer: Commercial Managed Care - POS

## 2013-08-08 ENCOUNTER — Emergency Department
Admission: EM | Admit: 2013-08-08 | Discharge: 2013-08-08 | Disposition: A | Discharge status: Home / Self Care | Payer: Commercial Managed Care - POS | Attending: Emergency Medicine | Admitting: Emergency Medicine

## 2013-08-08 DIAGNOSIS — R55 Syncope and collapse: Secondary | ICD-10-CM

## 2013-08-08 DIAGNOSIS — F17200 Nicotine dependence, unspecified, uncomplicated: Secondary | ICD-10-CM | POA: Insufficient documentation

## 2013-08-08 DIAGNOSIS — E282 Polycystic ovarian syndrome: Secondary | ICD-10-CM | POA: Insufficient documentation

## 2013-08-08 DIAGNOSIS — R42 Dizziness and giddiness: Secondary | ICD-10-CM | POA: Insufficient documentation

## 2013-08-08 DIAGNOSIS — R51 Headache: Secondary | ICD-10-CM | POA: Insufficient documentation

## 2013-08-08 DIAGNOSIS — R519 Headache, unspecified: Secondary | ICD-10-CM

## 2013-08-08 LAB — POCT URINALYSIS AUTOMATED (IAH)
Bilirubin, UA POCT: NEGATIVE
Glucose, UA POCT: NEGATIVE
Ketones, UA POCT: NEGATIVE mg/dL
Nitrite, UA POCT: NEGATIVE
PH, UA POCT: 7 (ref 4.6–8)
Protein, UA POCT: NEGATIVE mg/dL
Specific Gravity, UA POCT: 1.025 mg/dL (ref 1.001–1.035)
Urine Leukocytes POCT: NEGATIVE
Urobilinogen, UA POCT: 1 mg/dL

## 2013-08-08 LAB — POCT PREGNANCY TEST, URINE HCG: POCT Pregnancy HCG Test, UR: NEGATIVE

## 2013-08-08 MED ORDER — PROMETHAZINE HCL 25 MG PO TABS
25.0000 mg | ORAL_TABLET | Freq: Four times a day (QID) | ORAL | Status: DC | PRN
Start: 2013-08-08 — End: 2013-11-18

## 2013-08-08 MED ORDER — OXYCODONE-ACETAMINOPHEN 5-325 MG PO TABS
ORAL_TABLET | ORAL | Status: DC
Start: 2013-08-08 — End: 2013-11-18

## 2013-08-08 MED ORDER — OXYCODONE-ACETAMINOPHEN 5-325 MG PO TABS
1.0000 | ORAL_TABLET | Freq: Once | ORAL | Status: AC
Start: 2013-08-08 — End: 2013-08-08
  Administered 2013-08-08: 1 via ORAL
  Filled 2013-08-08: qty 1

## 2013-08-08 NOTE — ED Notes (Signed)
Pt. C/o "dizzy spells and fainting" for a few weeks along with intermittent HA and N/V. Pt. Wants to get checked. Pt. Denies dizzy at time of triage. Pt. Denies vision changes, urinary sxs. Pt. A&Ox4, able to ambulate without assistance.

## 2013-08-08 NOTE — ED Provider Notes (Signed)
EMERGENCY DEPARTMENT HISTORY AND PHYSICAL EXAM     Physician/Midlevel provider first contact with patient: 08/08/13 0257         Date: 08/08/2013  Patient Name: Brandy Allen  Attending Physician: Gweneth Dimitri DO  Diagnosis and Treatment Plan       Clinical Impression:   1. Near syncope    2. Dizziness    3. Headache        Treatment Plan:   ED Disposition     Discharge Brandy Allen discharge to home/self care.    Condition at disposition: Stable            History of Presenting Illness     Chief Complaint   Patient presents with   . Dizziness   . Headache   . Cough       History Provided By: pt  Chief Complaint: near syncope  Onset: x this morning   Timing: intermittent   Location: general   Quality: dizziness and feeling like going to pass out.   Severity: moderate   Modifying Factors: doesn't drink enough fluids, worse with heavy lifting.   Associated sxs: HA, N/V, and fatigue    Additional History: Brandy Allen is a 22 y.o. female c/o near syncope this morning with associated sx of HA, N/V, and fatigue x this week. Pt states that her emesis episodes are intermit en, last episode on Tuesday (3x). Pt also has abdominal pain and has a hx pf PCOS. She She's Dr. Laureen Ochs) who gave her Phentermine x August, next appointment with GI in a few weeks. Pt denies any dysuria, vaginal discharge or bleeding and all other sx. Pt is a occasional smoker and alcohol drinker. Pt states that recently her bp and HR have been higher than normal. Pt denies all other all other sx.     PCP: Ardyth Man, MD      No current facility-administered medications for this encounter.  Current outpatient prescriptions:medroxyprogesterone (PROVERA) 10 MG tablet, Take 10 mg by mouth daily., Disp: , Rfl: ;  phentermine 37.5 MG capsule, Take 37.5 mg by mouth every morning., Disp: , Rfl: ;  oxyCODONE-acetaminophen (PERCOCET) 5-325 MG per tablet, 1 tablet by mouth every 4-6 hours as needed for pain;  Do not drive or operate machinery while  taking this medicine, Disp: 12 tablet, Rfl: 0  promethazine (PHENERGAN) 25 MG tablet, Take 1 tablet (25 mg total) by mouth every 6 (six) hours as needed for Nausea., Disp: 30 tablet, Rfl: 0;  [DISCONTINUED] norethindrone (MICRONOR) 0.35 MG tablet, Take 1 tablet by mouth daily., Disp: , Rfl:     Past Medical History     Past Medical History   Diagnosis Date   . PCOS (polycystic ovarian syndrome)      Past Surgical History   Procedure Date   . Tonsillectomy        Family History     History reviewed. No pertinent family history.    Social History     History     Social History   . Marital Status: Single     Spouse Name: N/A     Number of Children: N/A   . Years of Education: N/A     Social History Main Topics   . Smoking status: Current Some Day Smoker     Types: Cigarettes   . Smokeless tobacco: Not on file   . Alcohol Use: Yes   . Drug Use: No   . Sexually Active: Not on file  Other Topics Concern   . Not on file     Social History Narrative   . No narrative on file        Allergies     Allergies   Allergen Reactions   . Latex        Review of Systems     General: No fever, sweats or chills.  HENT: +Headache, no neck pain, no nasal congestion, no sore throat  Respiratory: No cough, no shortness of breath.  Cardiovascular: No chest pain, no leg swelling.   Gastrointestinal: No abdominal pain, +nausea, +vomiting , no diarrhea.   Genito-Urinary: No dysuria, no hematuria  Musculoskeletal: No myalgias, no joint pains.   Neurological: No new focal weakness, no sensory changes.   Dermatological: No new rashes, no color changes.   Psychological: No acute mood changes, no confusion. No SI.     Physical Exam     BP 137/75  Pulse 93  Temp 97 F (36.1 C) (Oral)  Resp 20  Ht 1.778 m  Wt 161.027 kg  BMI 50.94 kg/m2  SpO2 100%    Constitutional: Vital signs reviewed. Well appearing, no apparent distress. Obese.   Head: Normocephalic, atraumatic. No external trauma noted.  Eyes: Conjunctiva and sclera are normal.  Extraocular movements intact, pupils equal, round, reactive.  Ear, Nose, Throat:  Normal external examination of the nose and ears. Oropharynx clear, moist mucous membranes.  Neck: Supple. Trachea midline.  No cervical lymphadenopathy. No midline cervical spine tenderness.  Respiratory/Chest: Clear to auscultation. No respiratory distress. No wheezing, rhonchi or rales.  Cardiovascular: Regular rate. Regular rhythm. S1, S2. No murmurs.  Abdomen: Normoactive Bowel sounds. Soft. Non-tender to palpation.  No guarding or rebound.  Back: No midline tenderness, no CVA tenderness.   Extremities: Upper and lower extremities with no cyanosis. No edema. No calf tenderness. Normal +2 pulses in all extremities.  Skin: Warm and dry. No rash.  Neuro: alert and appropriate,  upper and lower extremities normal strength, sensation intact.CN 2-12 intact, normal speech, normal finger-to-nose exam     Diagnostic Study Results     Labs -     Results     Procedure Component Value Units Date/Time    POCT UA Clinitek AX (urine dipstick) [[id number]]  (Abnormal) Collected:08/08/13 0257     Color UA POCT Yellow Updated:08/08/13 0318     Clarity UA POCT Clear      Glucose, UA POCT Negative      Bilirubin, UA POCT Negative      Ketones, UA POCT Negative mg/dL      Specific Gravity, UA POCT 1.025 mg/dL      Blood, UA POCT  Trace - lysed (A)      PH, UA POCT 7.0      Protein, UA POCT Negative mg/dL      Urobilinogen, UA POCT 1.0 mg/dL      Nitrite, UA POCT Negative      Leukocytes, UA POCT Negative     Urine HCG POC [[id number]] Collected:08/08/13 0317     POCT QC Pass Updated:08/08/13 0317     POCT Pregnancy HCG Test, UR Negative      Comment:        Result:     Negative Value is Normal in Healthy Males or Healthy non-pregnant Females          Radiologic Studies -   Radiology Results (24 Hour)     ** No Results found for the last 24 hours. **      .  Medical Decision Making   I am the first provider for this patient.    I reviewed the vital  signs, available nursing notes, past medical history, past surgical history, family history and social history.    Vital Signs-Reviewed the patient's vital signs.     Patient Vitals for the past 12 hrs:   BP Temp Pulse Resp   08/08/13 0339 137/75 mmHg - 93  -   08/08/13 0231 132/89 mmHg 97 F (36.1 C) 97  20      Pulse ox: 100% on RA-normal.     EKG:  Interpreted by the EP.   Time Interpreted: 3:05 AM   Rate: 96   Rhythm: Normal Sinus Rhythm    Interpretation:No ST elevation or depression, normal axis, normal intervals    Comparison: when compared to July 25, 2011, decrease in HR. No acute change otherwise    ED Course:   3:47 AM - Pt admits that she does not drink enough water and we discussed that she needs to stay more hydrated. Pt wants more pain meds before she leaves and now she has family at bed side.   3:48 AM  Pt is feeling better and would like to go home.  Discussed test results with pt and counseled on diagnosis, f/u plans, and signs and symptoms when to return to ED.  Pt is stable and ready for discharge.        Provider Notes: MDM: Pt with vague symptoms of dizziness, near syncope without syncope with intermittent headache and nausea and vomiting, irregular periods but just started on hormone therapy- no head trauma, normal neuro exam, no neck stiffness or fever and no vision changes to suggest ICH, SAH, traumatic injury, CVA, meningitis, acute closure glaucoma as reason for headache- check hcg and negative, orthostatics and became tachycardiac so suspect dehydration and pt admits doesn't drink enough fluids- also think could be a component of these medications and advise OBGYN follow up.       Doctor's Notes     Throughout the stay in the Emergency Department, questions and concerns surrounding pain control, care plans, diagnostic studies, effects of medications administered or prescribed, and future prognostic dilemmas were assessed and addressed.    ROS addendum: The patient and/or family was  asked if they had any other complaints or concerns that we could address today and nothing of significance was noted.     _______________________________  Medical DeMedical Decision Makingcision Stacy Gardner  Attestations:    This note is prepared by Breck Coons, acting as Scribe for Gweneth Dimitri, DO.     Gweneth Dimitri, DO: The scribe's documentation has been prepared under my direction and personally reviewed by me in its entirety.  I confirm that the note above accurately reflects all work, treatment, procedures, and medical decision making performed by me.        Madaline Brilliant, DO  08/09/13 [phone]

## 2013-08-08 NOTE — Discharge Instructions (Signed)
Discuss medications with your OB/GYN as these might be causing your symptoms. Please take nausea and pain medications as directed. Please stay hydrated with fluids.     Near Syncope    You have been diagnosed with a "Near-Fainting" spell, also called "Near-Syncope."    Syncope is the medical term for "loss of consciousness," also known as a fainting spell or simply fainting. "Near-Syncope" means almost fainting. Some of the same things that cause a complete faint can cause an "almost-faint."    There are many reasons for fainting episodes. Some fainting episodes can be from life-threatening causes, while others can be from non-serious causes. Most patients with life-threatening causes are admitted to the hospital for further testing. Patients thought to have non-life threatening causes may be discharged home.    Some non-life threatening causes of near-syncope include dehydration, heat exhaustion, vasovagal events (simple faint), and new medication side-effects.    Treatment of syncope depends on the cause. In general it is a good idea to drink plenty of fluids and avoid strenuous activity for at least 24 hours after a near-fainting episode.    You should follow up with your primary care doctor for a recheck.    YOU SHOULD SEEK MEDICAL ATTENTION IMMEDIATELY, EITHER HERE OR AT THE NEAREST EMERGENCY DEPARTMENT, IF ANY OF THE FOLLOWING OCCURS:   You have recurrent episodes of fainting.   You have any chest pain with your fainting.   You notice any palpitations or strange heart beats before fainting.   You notice any blood in your stools.   You are feeling weak, lightheaded, or not improving as expected.   You develop any other worsening symptoms or concerns.      Dizziness, Nonspecific    You have been seen for dizziness.     Dizziness can mean different things to different people. Some people use dizziness to mean the feeling of spinning when there is no actual movement. This often causes nausea  (feeling sick). The medical term for this is "vertigo." Others people use the word dizzy to mean "feeling lightheaded," like you might faint. This feeling is usually made better when lying down. For some people, neither of these describes how they are feeling. It can just be a feeling that makes you unsteady. This feeling is common in older people. It can be caused by a number of things. These include poor vision or hearing, foot problems and arthritis. It can also be caused by middle ear or sinus problems. The feeling can come and go.    Dizziness is also caused by more serious things. This includes strokes and heart problems.    It is NEVER normal to have the kind of dizziness you have today together with:   Chest pain.   Problems walking because of problems with balance. Especially if you are falling to one side.   Weakness, numbness or tingling in a part of your body.   Drooping of one side of your face.   Confusion.   Severe headache.   Problems speaking.     If you have these symptoms, it is VERY IMPORTANT to go to the nearest emergency department.    Your tests today were negative (normal). This means we found no life-threatening causes for your dizziness. It is safe for you to go home.    See your primary care doctor for more work-up of your dizziness.     YOU SHOULD SEEK MEDICAL ATTENTION IMMEDIATELY, EITHER HERE OR AT THE NEAREST EMERGENCY DEPARTMENT,  IF ANY OF THE FOLLOWING OCCUR:   You cannot speak clearly (slurring), one side of your face droops or you feel weak in the arms or legs (especially on one side).   You have problems with your balance.   You have problems hearing or there is ringing or a feeling of fullness in your ear.   You lose consciousness ("pass out" or faint).   You have severe headache with dizziness.   You have fever greater than 100.51F (38C).   You fall and hit your head.

## 2013-08-09 LAB — ECG 12-LEAD
Atrial Rate: 96 {beats}/min
P Axis: 22 degrees
P-R Interval: 168 ms
Q-T Interval: 368 ms
QRS Duration: 82 ms
QTC Calculation (Bezet): 464 ms
R Axis: 52 degrees
T Axis: 15 degrees
Ventricular Rate: 96 {beats}/min

## 2013-11-18 ENCOUNTER — Emergency Department: Payer: Commercial Managed Care - POS

## 2013-11-18 ENCOUNTER — Emergency Department: Payer: Commercial Managed Care - HMO

## 2013-11-18 ENCOUNTER — Emergency Department
Admission: EM | Admit: 2013-11-18 | Discharge: 2013-11-18 | Disposition: A | Discharge status: Home / Self Care | Payer: Commercial Managed Care - HMO | Attending: Emergency Medicine | Admitting: Emergency Medicine

## 2013-11-18 DIAGNOSIS — Z86711 Personal history of pulmonary embolism: Secondary | ICD-10-CM | POA: Insufficient documentation

## 2013-11-18 DIAGNOSIS — Z86718 Personal history of other venous thrombosis and embolism: Secondary | ICD-10-CM | POA: Insufficient documentation

## 2013-11-18 DIAGNOSIS — S86912A Strain of unspecified muscle(s) and tendon(s) at lower leg level, left leg, initial encounter: Secondary | ICD-10-CM

## 2013-11-18 DIAGNOSIS — F17200 Nicotine dependence, unspecified, uncomplicated: Secondary | ICD-10-CM | POA: Insufficient documentation

## 2013-11-18 DIAGNOSIS — Z760 Encounter for issue of repeat prescription: Secondary | ICD-10-CM | POA: Insufficient documentation

## 2013-11-18 DIAGNOSIS — X58XXXA Exposure to other specified factors, initial encounter: Secondary | ICD-10-CM | POA: Insufficient documentation

## 2013-11-18 DIAGNOSIS — IMO0002 Reserved for concepts with insufficient information to code with codable children: Secondary | ICD-10-CM | POA: Insufficient documentation

## 2013-11-18 HISTORY — DX: Acute embolism and thrombosis of unspecified deep veins of unspecified lower extremity: I82.409

## 2013-11-18 HISTORY — DX: Other pulmonary embolism without acute cor pulmonale: I26.99

## 2013-11-18 MED ORDER — ONDANSETRON 4 MG PO TBDP
4.0000 mg | ORAL_TABLET | Freq: Four times a day (QID) | ORAL | Status: AC | PRN
Start: 2013-11-18 — End: 2013-11-25

## 2013-11-18 MED ORDER — IBUPROFEN 600 MG PO TABS
600.0000 mg | ORAL_TABLET | Freq: Four times a day (QID) | ORAL | Status: DC | PRN
Start: 2013-11-18 — End: 2013-11-18

## 2013-11-18 MED ORDER — PANTOPRAZOLE SODIUM 40 MG PO TBEC
40.0000 mg | DELAYED_RELEASE_TABLET | Freq: Every day | ORAL | Status: DC
Start: 2013-11-18 — End: 2016-09-19

## 2013-11-18 NOTE — ED Provider Notes (Signed)
EMERGENCY DEPARTMENT HISTORY AND PHYSICAL EXAM    Date: 11/18/2013   Physician/Midlevel provider first contact with patient: 11/18/13 8657       Patient Name: Brandy Allen  Attending Physician: Jethro Bastos, MD  Mid-level: Tula Nakayama, PA-C      History of Presenting Illness     Chief Complaint   Patient presents with   . Leg Pain       History Provided By: patient    Chief Complaint: Lt calf pain     Brandy Allen is a 23 y.o. female complaining of Lt calf pain that began yesterday while at work.  Pt states she had cramping pain, worse with movement.  Pt denies any swelling to the leg.  Pt states she has baseline intermittent SOB, but denies any new SOB, DOE, chest pain, or fever.  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    PCP: Ardyth Man, MD    No current facility-administered medications for this encounter.     Current Outpatient Prescriptions   Medication Sig Dispense Refill   . ibuprofen (ADVIL,MOTRIN) 600 MG tablet Take 1 tablet (600 mg total) by mouth every 6 (six) hours as needed for Pain.  60 tablet  1   . medroxyprogesterone (PROVERA) 10 MG tablet Take 10 mg by mouth daily.       . [DISCONTINUED] oxyCODONE-acetaminophen (PERCOCET) 5-325 MG per tablet 1 tablet by mouth every 4-6 hours as needed for pain;  Do not drive or operate machinery while taking this medicine  12 tablet  0   . [DISCONTINUED] phentermine 37.5 MG capsule Take 37.5 mg by mouth every morning.       . [DISCONTINUED] promethazine (PHENERGAN) 25 MG tablet Take 1 tablet (25 mg total) by mouth every 6 (six) hours as needed for Nausea.  30 tablet  0       Past Medical History     Past Medical History   Diagnosis Date   . PCOS (polycystic ovarian syndrome)    . DVT (deep venous thrombosis)    . PE (pulmonary embolism)      Past Surgical History   Procedure Date   . Tonsillectomy        Family History     No family history on file.    Social History     History     Social  History   . Marital Status: Single     Spouse Name: N/A     Number of Children: N/A   . Years of Education: N/A     Social History Main Topics   . Smoking status: Current Some Day Smoker     Types: Cigarettes   . Smokeless tobacco: Not on file   . Alcohol Use: Yes   . Drug Use: No   . Sexually Active: Not on file     Other Topics Concern   . Not on file     Social History Narrative   . No narrative on file         Allergies     Allergies   Allergen Reactions   . Latex        Review of Systems     Review of Systems   Constitutional: Negative for fever and chills.   Respiratory: Negative for cough, shortness of breath and wheezing.    Cardiovascular: Negative for chest pain, palpitations, orthopnea and leg  swelling.   Gastrointestinal: Negative for nausea, vomiting and abdominal pain.   Genitourinary: Negative for flank pain.   Musculoskeletal: Positive for myalgias. Negative for back pain, falls, joint pain and neck pain.   Neurological: Negative for dizziness and tingling.   All other systems reviewed and are negative.            Physical Exam   BP 140/86  Pulse 96  Temp 97.6 F (36.4 C)  Resp 20  SpO2 98%    CONSTITUTIONAL:  Oriented to person, place, and time.  Well-developed and well-nourished.  No acute distress. Non-toxic appearance.  Vital signs reviewed.  RESP:  No respiratory distress.  Breath sounds with no rales, wheezing, or rhonchi. No chest wall tenderness.  CARDIAC:  Regular rate and rhythm.  Heart sounds with no murmurs, gallop, or friction rub.  ABD:  Obese.  Normal bowel sounds x4 quadrants.  Soft.  Nontender.    BACK:  No CVA tenderness.  MS:  No lacerations, contusions, or deformity.  Full active and passive range of motion. 2+ pulses.  Mild Lt calf tenderness.  Worse pain with dorsiflexion of foot.  No edema.  SKIN:  Skin is warm and dry.  No rashes, lesions, contusions, or drainage.  NEURO:  A0X3.  Sensory intact throughout  PSYCH:  Mood, memory, affect, and judgment normal for  age.      Diagnostic Study Results     Labs -     Results     ** No Results found for the last 24 hours. **          Radiologic Studies -   Radiology Results (24 Hour)     Procedure Component Value Units Date/Time    US Venous Dopp Left Low Extrem [[id number]]     Order Status:Sent  Updated:11/18/13 0423      US Venous Dopp Left Low Extrem - prelim - no DVT.    Clinical Course in the Emergency Department     3:29 AM  LLE doppler ordered.    4:37 AM  Reviewed doppler with pt.  Reviewed case with Dr Layla Barter.  Will discharge pt home on Motrin 600mg  #60 prn.  Rest and relax.  Return for worse pain, swelling, fevers, SOB, or problems.  F/u with PMD in 2 days for re-eval.    4:50 AM  Pt requesting refills of her chronic abd pain medications.  Pt followed outpt for ongoing pain and gastritis with nausea.  Protonix 40mg  #30 and Zofran 4mg  ODT #30 written.  Will discontinue Motrin due to gastritis.  Topical medication to calf prn.    Medical Decision Making     Differential diagnosis considered in this case:  DVT, muscle strain      I reviewed the vital signs, available nursing notes, past medical history, past surgical history, family history and social history.    Vital Signs-Reviewed the patient's vital signs.     Patient Vitals for the past 12 hrs:   BP Temp Pulse Resp   11/18/13 0305 140/86 mmHg 97.6 F (36.4 C) 96  20          Diagnosis and Treatment Plan       Clinical Impression:   1. Muscle strain of left lower leg, initial encounter            Attending's Note (MLP) Attestestation           Tula Nakayama, PA  11/18/13 0439    Tula Nakayama, PA  11/18/13  [address]    Tula Nakayama, [place]  11/18/13 [phone]

## 2013-11-18 NOTE — ED Notes (Signed)
Pt c/o pain to left calf x24 hours.  States feels similar to previous DVT in same leg.   On BCP, does not smoke, no recent travel.  No meds at home.

## 2013-11-18 NOTE — ED Provider Notes (Signed)
The pt was seen by the midlevel provider.  I discussed the treatment and the plan of care with the midlevel provider.  I agree with the treatment plan and assessment.      Jethro Bastos, MD  11/18/13 [phone]

## 2014-06-21 DIAGNOSIS — M5440 Lumbago with sciatica, unspecified side: Secondary | ICD-10-CM | POA: Insufficient documentation

## 2016-09-19 ENCOUNTER — Emergency Department: Payer: Medicaid HMO

## 2016-09-19 ENCOUNTER — Emergency Department
Admission: EM | Admit: 2016-09-19 | Discharge: 2016-09-19 | Disposition: A | Discharge status: Home / Self Care | Payer: Medicaid HMO | Attending: Emergency Medical Services | Admitting: Emergency Medical Services

## 2016-09-19 ENCOUNTER — Emergency Department: Payer: Medicaid Other

## 2016-09-19 DIAGNOSIS — Z86711 Personal history of pulmonary embolism: Secondary | ICD-10-CM | POA: Insufficient documentation

## 2016-09-19 DIAGNOSIS — R519 Headache, unspecified: Secondary | ICD-10-CM

## 2016-09-19 DIAGNOSIS — Z86718 Personal history of other venous thrombosis and embolism: Secondary | ICD-10-CM | POA: Insufficient documentation

## 2016-09-19 DIAGNOSIS — H539 Unspecified visual disturbance: Secondary | ICD-10-CM | POA: Insufficient documentation

## 2016-09-19 DIAGNOSIS — R51 Headache: Secondary | ICD-10-CM | POA: Insufficient documentation

## 2016-09-19 DIAGNOSIS — F17210 Nicotine dependence, cigarettes, uncomplicated: Secondary | ICD-10-CM | POA: Insufficient documentation

## 2016-09-19 LAB — COMPREHENSIVE METABOLIC PANEL
ALT: 22 U/L (ref 0–55)
AST (SGOT): 22 U/L (ref 5–34)
Albumin/Globulin Ratio: 0.8 — ABNORMAL LOW (ref 0.9–2.2)
Albumin: 3.3 g/dL — ABNORMAL LOW (ref 3.5–5.0)
Alkaline Phosphatase: 74 U/L (ref 37–106)
Anion Gap: 7 (ref 5.0–15.0)
BUN: 9 mg/dL (ref 7.0–19.0)
Bilirubin, Total: 0.3 mg/dL (ref 0.2–1.2)
CO2: 27 mEq/L (ref 22–29)
Calcium: 9.4 mg/dL (ref 8.5–10.5)
Chloride: 106 mEq/L (ref 100–111)
Creatinine: 0.9 mg/dL (ref 0.6–1.0)
Globulin: 4.4 g/dL — ABNORMAL HIGH (ref 2.0–3.6)
Glucose: 86 mg/dL (ref 70–100)
Potassium: 3.9 mEq/L (ref 3.5–5.1)
Protein, Total: 7.7 g/dL (ref 6.0–8.3)
Sodium: 140 mEq/L (ref 136–145)

## 2016-09-19 LAB — CBC AND DIFFERENTIAL
Absolute NRBC: 0 10*3/uL
Basophils Absolute Automated: 0.02 10*3/uL (ref 0.00–0.20)
Basophils Automated: 0.4 %
Eosinophils Absolute Automated: 0.07 10*3/uL (ref 0.00–0.70)
Eosinophils Automated: 1.3 %
Hematocrit: 39.1 % (ref 37.0–47.0)
Hgb: 11.7 g/dL — ABNORMAL LOW (ref 12.0–16.0)
Immature Granulocytes Absolute: 0.01 10*3/uL
Immature Granulocytes: 0.2 %
Lymphocytes Absolute Automated: 2.48 10*3/uL (ref 0.50–4.40)
Lymphocytes Automated: 46.4 %
MCH: 23.4 pg — ABNORMAL LOW (ref 28.0–32.0)
MCHC: 29.9 g/dL — ABNORMAL LOW (ref 32.0–36.0)
MCV: 78.4 fL — ABNORMAL LOW (ref 80.0–100.0)
MPV: 8.7 fL — ABNORMAL LOW (ref 9.4–12.3)
Monocytes Absolute Automated: 0.36 10*3/uL (ref 0.00–1.20)
Monocytes: 6.7 %
Neutrophils Absolute: 2.4 10*3/uL (ref 1.80–8.10)
Neutrophils: 45 %
Nucleated RBC: 0 /100 WBC (ref 0.0–1.0)
Platelets: 285 10*3/uL (ref 140–400)
RBC: 4.99 10*6/uL (ref 4.20–5.40)
RDW: 17 % — ABNORMAL HIGH (ref 12–15)
WBC: 5.34 10*3/uL (ref 3.50–10.80)

## 2016-09-19 LAB — URINALYSIS, REFLEX TO MICROSCOPIC EXAM IF INDICATED
Bilirubin, UA: NEGATIVE
Glucose, UA: NEGATIVE
Leukocyte Esterase, UA: NEGATIVE
Nitrite, UA: NEGATIVE
Protein, UR: 30 — AB
Specific Gravity UA: 1.026 (ref 1.001–1.035)
Urine pH: 6 (ref 5.0–8.0)
Urobilinogen, UA: NEGATIVE mg/dL

## 2016-09-19 LAB — URINE BHCG POC: Urine bHCG POC: NEGATIVE

## 2016-09-19 LAB — GFR: EGFR: >60

## 2016-09-19 LAB — HEMOLYSIS INDEX: Hemolysis Index: 0 (ref 0–18)

## 2016-09-19 MED ORDER — BUTALBITAL-APAP-CAFFEINE 50-325-40 MG PO TABS
1.0000 | ORAL_TABLET | ORAL | 0 refills | Status: DC | PRN
Start: 2016-09-19 — End: 2016-12-08

## 2016-09-19 MED ORDER — METOCLOPRAMIDE HCL 5 MG/ML IJ SOLN
10.0000 mg | Freq: Once | INTRAMUSCULAR | Status: AC
Start: 2016-09-19 — End: 2016-09-19
  Administered 2016-09-19: 10 mg via INTRAVENOUS
  Filled 2016-09-19: qty 2

## 2016-09-19 MED ORDER — MAGNESIUM SULFATE IN D5W 1-5 GM/100ML-% IV SOLN
1.0000 g | Freq: Once | INTRAVENOUS | Status: AC
Start: 2016-09-19 — End: 2016-09-19
  Administered 2016-09-19: 1 g via INTRAVENOUS
  Filled 2016-09-19: qty 100

## 2016-09-19 MED ORDER — KETOROLAC TROMETHAMINE 30 MG/ML IJ SOLN
30.0000 mg | Freq: Once | INTRAMUSCULAR | Status: DC
Start: 2016-09-19 — End: 2016-09-19

## 2016-09-19 MED ORDER — SODIUM CHLORIDE 0.9 % IV BOLUS
1000.0000 mL | Freq: Once | INTRAVENOUS | Status: AC
Start: 2016-09-19 — End: 2016-09-19
  Administered 2016-09-19: 1000 mL via INTRAVENOUS

## 2016-09-19 NOTE — ED Provider Notes (Signed)
EMERGENCY DEPARTMENT HISTORY AND PHYSICAL EXAM     Physician/Midlevel provider first contact with patient: 09/19/16 1241         Date: 09/19/2016  Patient Name: Brandy Allen    History of Presenting Illness     Chief Complaint   Patient presents with   . Headache   . Loss of Vision       History Provided By: Patient    Chief Complaint: Migraine  Duration 1 week  Timing: Intermittent  Location: frontal  Quality: painful, pressure  Severity: Moderate  Exacerbating factors: none  Alleviating factors: none  Associated Symptoms: vision changes, leg swelling  Pertinent Negatives: focal weakness, dysuria, urgency.    Additional History: Brandy Allen is a 25 y.o. female presenting to the ED with intermittent migraine x 1 week. Pt reports that she has had an intermittent, frontal migraine for the last week. She states the pain feels like a pressure. She reports that she experienced vision changes this morning in her right eye. Pt notes that she was experiencing partial blurry vision out of her right eye for about 15 minutes. She notes her sx have resolved and states this has happened to her in the past. Pt further reports leg swelling that has been ongoing for a while. Pt has not taken any medications for her sx. She states she had an appointment with her hematologist today however, when pt explained her sx, she was told to come to the ED. Pt is also scheduled to see a neurologist next month. No focal weakness, dysuria, or urgency.          PCP: Ardyth Man, MD  SPECIALISTS:    No current facility-administered medications for this encounter.      Current Outpatient Prescriptions   Medication Sig Dispense Refill   . butalbital-acetaminophen-caffeine (FIORICET, ESGIC) 50-325-40 MG per tablet Take 1 tablet by mouth every 4 (four) hours as needed for Headaches.for up to 15 doses 15 tablet 0       Past History     Past Medical History:  Past Medical History:   Diagnosis Date   . DVT (deep venous thrombosis)    . PCOS  (polycystic ovarian syndrome)    . PE (pulmonary embolism)        Past Surgical History:  Past Surgical History:   Procedure Laterality Date   . TONSILLECTOMY         Family History:  History reviewed. No pertinent family history.    Social History:  Social History   Substance Use Topics   . Smoking status: Current Some Day Smoker     Types: Cigarettes   . Smokeless tobacco: Never Used   . Alcohol use Yes       Allergies:  Allergies   Allergen Reactions   . Latex        Review of Systems     Review of Systems   Eyes: Positive for visual disturbance.   Cardiovascular: Positive for leg swelling.   Neurological: Positive for headaches.   All other systems reviewed and are negative.        Physical Exam   BP 130/63   Pulse 74   Temp 97.7 F (36.5 C)   Resp 18   Ht 6' (1.829 m)   Wt (!) 163.3 kg   LMP 08/26/2016   SpO2 100%   BMI 48.82 kg/m         Nursing note and vitals reviewed.  Constitutional:  Well developed, well nourished.  Awake & Oriented x3.  Head:  Atraumatic. Normocephalic.    Eyes:  PERRL. EOMI. Conjunctivae are not pale. Visual field exam: normal. Right eye: 20/15. Left eye 20/20. Both eyes: 20/20.  ENT:  Mucous membranes are moist and intact. Oropharynx is clear and symmetric.  Patent airway.  Neck:  Supple. Full ROM.    Cardiovascular:  Regular rate. Regular rhythm. No murmurs, rubs, or gallops.  Respiratory:  No evidence of respiratory distress. Clear to auscultation bilaterally.  No wheezing, rales or rhonchi.   GI:  Soft and non-distended. There is no tenderness. No rebound, guarding, or rigidity.  Back:  Full ROM. Nontender.  MSK:  No edema. No cyanosis. No clubbing. Full range of motion in all extremities.  Skin:  Skin is warm and dry.  No diaphoresis. No rash.   Neurological:  Alert, awake, and appropriate. Normal speech. Motor normal.  Psychiatric:  Good eye contact. Normal interaction, affect, and behavior.       Diagnostic Study Results     Labs -     Results     Procedure Component  Value Units Date/Time    GFR [[id number]] Collected:  09/19/16 1330     Updated:  09/19/16 1352     EGFR >60.0    Comprehensive metabolic panel [[id number]]  (Abnormal) Collected:  09/19/16 1330    Specimen:  Blood Updated:  09/19/16 1352     Glucose 86 mg/dL      BUN 9.0 mg/dL      Creatinine 0.9 mg/dL      Sodium 948 mEq/L      Potassium 3.9 mEq/L      Chloride 106 mEq/L      CO2 27 mEq/L      Calcium 9.4 mg/dL      Protein, Total 7.7 g/dL      Albumin 3.3 (L) g/dL      AST (SGOT) 22 U/L      ALT 22 U/L      Alkaline Phosphatase 74 U/L      Bilirubin, Total 0.3 mg/dL      Globulin 4.4 (H) g/dL      Albumin/Globulin Ratio 0.8 (L)     Anion Gap 7.0    Hemolysis index [[id number]] Collected:  09/19/16 1330     Updated:  09/19/16 1352     Hemolysis Index 0    UA, Reflex to Microscopic [[id number]]  (Abnormal) Collected:  09/19/16 1330    Specimen:  Urine Updated:  09/19/16 1346     Urine Type Clean Catch     Color, UA Yellow     Clarity, UA Hazy     Specific Gravity UA 1.026     Urine pH 6.0     Leukocyte Esterase, UA Negative     Nitrite, UA Negative     Protein, UR 30 (A)     Glucose, UA Negative     Ketones UA Trace (A)     Urobilinogen, UA Negative mg/dL      Bilirubin, UA Negative     Blood, UA Small (A)     RBC, UA 3 - 5 /hpf      WBC, UA 0 - 5 /hpf      Squamous Epithelial Cells, Urine 0 - 5 /hpf     Urine BHCG POC [[id number]] Collected:  09/19/16 1336     Updated:  09/19/16 1342     Urine bHCG POC Negative    CBC and differential [[id number]]  (Abnormal) Collected:  09/19/16 1330    Specimen:  Blood from Blood Updated:  09/19/16 1338     WBC 5.34 x10 3/uL      Hgb 11.7 (L) g/dL      Hematocrit 54.0 %      Platelets 285 x10 3/uL      RBC 4.99 x10 6/uL      MCV 78.4 (L) fL      MCH 23.4 (L) pg      MCHC 29.9 (L) g/dL      RDW 17 (H) %      MPV 8.7 (L) fL      Neutrophils 45.0 %      Lymphocytes Automated 46.4 %      Monocytes 6.7 %      Eosinophils Automated 1.3 %      Basophils Automated 0.4 %      Immature  Granulocyte 0.2 %      Nucleated RBC 0.0 /100 WBC      Neutrophils Absolute 2.40 x10 3/uL      Abs Lymph Automated 2.48 x10 3/uL      Abs Mono Automated 0.36 x10 3/uL      Abs Eos Automated 0.07 x10 3/uL      Absolute Baso Automated 0.02 x10 3/uL      Absolute Immature Granulocyte 0.01 x10 3/uL      Absolute NRBC 0.00 x10 3/uL           Radiologic Studies -   Radiology Results (24 Hour)     Procedure Component Value Units Date/Time    CT Head without Contrast [[id number]] Collected:  09/19/16 1418    Order Status:  Completed Updated:  09/19/16 1423    Narrative:       CLINICAL INDICATION:  Headache    TECHNIQUE:  Axial noncontrast CT images were obtained from the skull  base to the vertex. A combination of a automatic exposure control,  adjustment of the mA and/or kV  according to patient size and/or use of  iterative reconstruction technique was utilized.      COMPARISON:  None available    INTERPRETATION: Subarachnoid spaces and ventricles are within normal  limits for age. There is no acute intracranial hemorrhage, extra-axial  collection, or mass-effect. Gray-white differentiation is maintained.  Visualized paranasal sinuses and mastoid air cells are clear.          Impression:         No acute intracranial abnormality.    Wyatt Portela, MD   09/19/2016 2:18 PM        .      ED Medication Orders     Start Ordered     Status Ordering Provider    09/19/16 1315 09/19/16 1314  sodium chloride 0.9 % bolus 1,000 mL  Once     Route: Intravenous  Ordered Dose: 1,000 mL     Last MAR action:  New Bag Analeah Brame SHU    09/19/16 1315 09/19/16 1314  magnesium sulfate 1g in dextrose 5% IVPB (premix)  Once     Route: Intravenous  Ordered Dose: 1 g     Last MAR action:  New Bag Magally Vahle SHU    09/19/16 1315 09/19/16 1314  metoclopramide (REGLAN) injection 10 mg  Once     Route: Intravenous  Ordered Dose: 10 mg     Last MAR action:  Given Riana Tessmer SHU    09/19/16 1315 09/19/16 1314    Once  Route: Intravenous  Ordered  Dose: 30 mg     Discontinued Waleed Dettman SHU            Medical Decision Making   I am the first provider for this patient.    I reviewed the vital signs, available nursing notes, past medical history, past surgical history, family history and social history.    Vital Signs-Reviewed the patient's vital signs.     No data found.      Pulse Oximetry Analysis - Normal 100% on RA    Clinical Decision Support:       Old Medical Records: Nursing notes.     ED Course:     3:21 PM - Patient feels better. I have discussed all testing results and plan of care with patient. Patient agrees with going home and following up and return if worsening symptoms. Side effects of medications such as dizziness associated with opioids and muscle relaxants, nausea/vomiting and constipation associated with opioids and potential complications of antibiotics i.e. c. diff, yeast infection, rash, allergic reaction discussed.       Provider Notes:   Likely migraine HA, r/o ich, r/o electrolyte abn, visual changes likely part of migraine headache, referred to pmd/outpatient specialist for further evaluation.       Diagnosis     Clinical Impression:   1. Nonintractable headache, unspecified chronicity pattern, unspecified headache type    2. Vision changes        Treatment Plan:   ED Disposition     ED Disposition Condition Date/Time Comment    Discharge  Wed Sep 19, 2016  3:22 PM Adline Mango discharge to home/self care.    Condition at disposition: Stable            _______________________________      Prescriptions:  Discharge Medication List as of 09/19/2016  3:22 PM      START taking these medications    Details   butalbital-acetaminophen-caffeine (FIORICET, ESGIC) 50-325-40 MG per tablet Take 1 tablet by mouth every 4 (four) hours as needed for Headaches.for up to 15 doses, Starting Wed 09/19/2016, Print                     Attestations: This note is prepared by Loreta Ave, acting as scribe for Zadie Rhine Ursala Cressy, MD.    Zadie Rhine Bernis Stecher, MD -  The scribe's documentation has been prepared under my direction and personally reviewed by me in its entirety.  I confirm that the note above accurately reflects all work, treatment, procedures, and medical decision making performed by me.       Shellie Rogoff, Zadie Rhine, MD PhD  09/21/16 [phone]

## 2016-09-19 NOTE — Discharge Instructions (Signed)
Please follow up with PMD.   Do not take more than 4 gm of tylenol from all sources in 24 hrs.         Headache    You have been treated for a headache.    Headaches are very common. Most of the time they are benign (not harmful). Some headaches can be very serious. Your headache appears to be benign. The doctor feels it is OK for you to go home.    If you continue to have headaches, or if this headache does not resolve over the next few days, you should be evaluated by your regular doctor or a neurologist. Keep a "headache diary." This may help your doctor learn the cause of your headaches.    Take your headache medication as directed. This is especially important if your doctor has placed you on a daily medication to prevent headaches.    YOU SHOULD SEEK MEDICAL ATTENTION IMMEDIATELY, EITHER HERE OR AT THE NEAREST EMERGENCY DEPARTMENT, IF ANY OF THE FOLLOWING OCCURS:   Your headache gets worse.   You have a severe headache that occurs suddenly.   Your head pain is different from your normal headache.   You have a fever (temperature higher than 100.42F / 38C), especially with a stiff neck.   You feel numbness, tingling, or weakness in your arms or legs.   You pass out.   You have problems with your vision.   You vomit and have trouble taking medication or keeping it down.               Blurry Vision    You have been seen for blurry vision.     Blurry vision is when your vision is cloudy or not sharp. It is hard to see small details. Changes in your eyesight can happen for several reasons. Some causes are serious. Others are not serious or harmful.    Some causes of blurry vision are:   Need for eyeglasses, contact lenses or a stronger prescription.   A cataract. This is when the lens of the eye gets cloudy. This can happen with age.   Glaucoma. This is higher pressure in the eye.   Retinal detachment (when the retina moves away from its support tissue).   High blood pressure.   Migraine  headache.   Diabetic retinopathy. This is a diabetes complication with bleeding on the retina.   Optic neuritis. This is when there is swelling of the optic nerve.   A side-effect of medicines.    In your case, the problem does not seem to be from anything serious. It is OK for you to go home today.     See your family doctor about your blurry vision.    YOU SHOULD SEEK MEDICAL ATTENTION IMMEDIATELY, EITHER HERE OR AT THE NEAREST EMERGENCY DEPARTMENT, IF ANY OF THE FOLLOWING OCCUR:   Your symptoms get worse.   It feels like there is a shade being pulled down over one of your eyes.   You have pain in your eye, especially if your eye is red.   You are blind in one or both of your eyes. This can be either partial or complete.   You have blind spots or see halos in parts of your vision that show up suddenly.   You have double vision.   You have any other problems or concerns.

## 2016-09-19 NOTE — ED Triage Notes (Addendum)
Patient presents to the ED with Migraine headache x 1 week and sudden onset of vision change right eye  this am. Denies any nausea/vomiting.

## 2016-09-21 ENCOUNTER — Telehealth: Payer: Self-pay | Admitting: Emergency Medical Services

## 2016-09-21 NOTE — Telephone Encounter (Signed)
Feeling better.

## 2016-12-08 ENCOUNTER — Emergency Department: Payer: BC Managed Care – PPO

## 2016-12-08 ENCOUNTER — Emergency Department
Admission: EM | Admit: 2016-12-08 | Discharge: 2016-12-08 | Disposition: A | Discharge status: Home / Self Care | Payer: BC Managed Care – PPO | Attending: Emergency Medicine | Admitting: Emergency Medicine

## 2016-12-08 DIAGNOSIS — M7989 Other specified soft tissue disorders: Secondary | ICD-10-CM | POA: Insufficient documentation

## 2016-12-08 DIAGNOSIS — R109 Unspecified abdominal pain: Secondary | ICD-10-CM | POA: Insufficient documentation

## 2016-12-08 DIAGNOSIS — G43809 Other migraine, not intractable, without status migrainosus: Secondary | ICD-10-CM | POA: Insufficient documentation

## 2016-12-08 DIAGNOSIS — Z87891 Personal history of nicotine dependence: Secondary | ICD-10-CM | POA: Insufficient documentation

## 2016-12-08 LAB — CBC AND DIFFERENTIAL
Absolute NRBC: 0 10*3/uL
Basophils Absolute Automated: 0.03 10*3/uL (ref 0.00–0.20)
Basophils Automated: 0.7 %
Eosinophils Absolute Automated: 0.07 10*3/uL (ref 0.00–0.70)
Eosinophils Automated: 1.5 %
Hematocrit: 37.5 % (ref 37.0–47.0)
Hgb: 11.5 g/dL — ABNORMAL LOW (ref 12.0–16.0)
Immature Granulocytes Absolute: 0 10*3/uL
Immature Granulocytes: 0 %
Lymphocytes Absolute Automated: 2.35 10*3/uL (ref 0.50–4.40)
Lymphocytes Automated: 51.2 %
MCH: 24.6 pg — ABNORMAL LOW (ref 28.0–32.0)
MCHC: 30.7 g/dL — ABNORMAL LOW (ref 32.0–36.0)
MCV: 80.3 fL (ref 80.0–100.0)
MPV: 9.3 fL — ABNORMAL LOW (ref 9.4–12.3)
Monocytes Absolute Automated: 0.3 10*3/uL (ref 0.00–1.20)
Monocytes: 6.5 %
Neutrophils Absolute: 1.84 10*3/uL (ref 1.80–8.10)
Neutrophils: 40.1 %
Nucleated RBC: 0 /100 WBC (ref 0.0–1.0)
Platelets: 324 10*3/uL (ref 140–400)
RBC: 4.67 10*6/uL (ref 4.20–5.40)
RDW: 17 % — ABNORMAL HIGH (ref 12–15)
WBC: 4.59 10*3/uL (ref 3.50–10.80)

## 2016-12-08 LAB — URINALYSIS, REFLEX TO MICROSCOPIC EXAM IF INDICATED
Bilirubin, UA: NEGATIVE
Blood, UA: NEGATIVE
Glucose, UA: NEGATIVE
Ketones UA: NEGATIVE
Leukocyte Esterase, UA: NEGATIVE
Nitrite, UA: NEGATIVE
Protein, UR: NEGATIVE
Specific Gravity UA: 1.017 (ref 1.001–1.035)
Urine pH: 6 (ref 5.0–8.0)
Urobilinogen, UA: NEGATIVE mg/dL

## 2016-12-08 LAB — COMPREHENSIVE METABOLIC PANEL
ALT: 26 U/L (ref 0–55)
AST (SGOT): 22 U/L (ref 5–34)
Albumin/Globulin Ratio: 0.9 (ref 0.9–2.2)
Albumin: 3.3 g/dL — ABNORMAL LOW (ref 3.5–5.0)
Alkaline Phosphatase: 76 U/L (ref 37–106)
Anion Gap: 8 (ref 5.0–15.0)
BUN: 9 mg/dL (ref 7.0–19.0)
Bilirubin, Total: 0.2 mg/dL (ref 0.2–1.2)
CO2: 28 mEq/L (ref 22–29)
Calcium: 9.1 mg/dL (ref 8.5–10.5)
Chloride: 105 mEq/L (ref 100–111)
Creatinine: 0.8 mg/dL (ref 0.6–1.0)
Globulin: 3.8 g/dL — ABNORMAL HIGH (ref 2.0–3.6)
Glucose: 89 mg/dL (ref 70–100)
Potassium: 4.5 mEq/L (ref 3.5–5.1)
Protein, Total: 7.1 g/dL (ref 6.0–8.3)
Sodium: 141 mEq/L (ref 136–145)

## 2016-12-08 LAB — URINE HCG QUALITATIVE: Urine HCG Qualitative: NEGATIVE

## 2016-12-08 LAB — GFR: EGFR: >60

## 2016-12-08 LAB — HEMOLYSIS INDEX: Hemolysis Index: 10 (ref 0–18)

## 2016-12-08 LAB — LIPASE: Lipase: 32 U/L (ref 8–78)

## 2016-12-08 MED ORDER — METOCLOPRAMIDE HCL 10 MG PO TABS
10.0000 mg | ORAL_TABLET | Freq: Four times a day (QID) | ORAL | 0 refills | Status: AC | PRN
Start: 2016-12-08 — End: ?

## 2016-12-08 MED ORDER — KETOROLAC TROMETHAMINE 30 MG/ML IJ SOLN
30.0000 mg | Freq: Once | INTRAMUSCULAR | Status: AC
Start: 2016-12-08 — End: 2016-12-08
  Administered 2016-12-08: 30 mg via INTRAVENOUS
  Filled 2016-12-08: qty 1

## 2016-12-08 MED ORDER — NAPROXEN 500 MG PO TABS
500.0000 mg | ORAL_TABLET | Freq: Two times a day (BID) | ORAL | 0 refills | Status: AC
Start: 2016-12-08 — End: ?

## 2016-12-08 MED ORDER — SODIUM CHLORIDE 0.9 % IV BOLUS
1000.0000 mL | Freq: Once | INTRAVENOUS | Status: AC
Start: 2016-12-08 — End: 2016-12-08
  Administered 2016-12-08: 1000 mL via INTRAVENOUS

## 2016-12-08 MED ORDER — METOCLOPRAMIDE HCL 5 MG/ML IJ SOLN
10.0000 mg | Freq: Once | INTRAMUSCULAR | Status: AC
Start: 2016-12-08 — End: 2016-12-08
  Administered 2016-12-08: 09:00:00 10 mg via INTRAVENOUS
  Filled 2016-12-08: qty 2

## 2016-12-08 NOTE — ED Triage Notes (Signed)
Brandy Allen cc migraine headache that started this AM, and feels like typical migraine. Pt reports some visual changes with headache, and nausea. Pt states vision has returned to normal, but she is still nauseous. Pt also c/o abdominal cramping x1 month. Pt has hx of polycystic ovaries, but states pain usually subsides after her cycle, but has persisted. PT has not taken any medications for the headache or abdominal cramps.

## 2016-12-08 NOTE — Discharge Instructions (Signed)
Headache, Migraine    You have been diagnosed with a recurrent migraine headache.    Headache is a very common complaint. Most headaches are not dangerous. Your symptoms today sound like your usual headache. It is OK for you to go home. It is important, however, to follow up with your doctor or neurologist.    Migraines are treated with medication to reduce pain. Medications specifically designed for migraine headaches are usually more effective than other pain medications. Ask your doctor about migraine medicines.    Take your medication as directed. This is especially important if your doctor has placed you on a daily medication to prevent headaches.    YOU SHOULD SEEK MEDICAL ATTENTION IMMEDIATELY, EITHER HERE OR AT THE NEAREST EMERGENCY DEPARTMENT, IF ANY OF THE FOLLOWING OCCURS:   Your headache gets worse or does not improve with medication.   You have head pain that is different from your normal headache.   You have a very severe headache that begins suddenly (like an explosion in your head or like a thunderclap).   You have a fever (temperature higher than 100.33F / 38C).   You have loss of feeling or tingling in your arms or legs.   You pass out.   You develop vision problems.   You vomit and cannot take medication or keep medication down.             LE Edema Etiology Unknown    You have been seen today for swelling of your leg(s).    Despite the doctor's evaluation, the cause of the swelling is unknown.    There are many possible causes for leg swelling. Below is a description of some of these causes:   Deep Venous Thrombus (DVT): This is a blood clot in one of the deep veins of your leg. Symptoms can include leg pain and swelling. The diagnosis is often made with a leg ultrasound which can detect a blockage in the veins.   Congestive heart failure is a condition where some fluid builds up in the lungs because of a heart problem. The main symptom is shortness of breath which worsens when  you lie down. You may also notice leg swelling and weight gain.   Cellulitis: This is a bacterial infection of the skin. Symptoms are usually redness, swelling, and warmth in the affected area. Some people get a fever (temperature higher than 100.33F / 38C) with this infection.   Venous Stasis: This happens when blood pools in the legs for some time. The legs get swollen and sometimes get red. The swelling gets better at night because lying flat makes it easier for blood to return to the heart. During the day as a person stands or sits with their legs dangling down, the blood has trouble getting out of the legs again and swelling gets worse.   Low amounts of Albumin: Albumin is a type of protein that is carried in the blood stream. One thing it does is keep the fluid part of the blood from leaking out of the blood vessels and into the surrounding tissues. Low albumin can be from poor nutrition, alcoholism, liver disease or other chronic (ongoing) illnesses.    The exact cause of the swelling is not known even though tests may have been done here today. Even though the cause is not known, the doctor feels that it is OK for you to go home. You need to see your doctor or referral doctor for more tests.  tests.      YOU SHOULD SEEK MEDICAL ATTENTION IMMEDIATELY, EITHER HERE OR AT THE NEAREST EMERGENCY DEPARTMENT, IF ANY OF THE FOLLOWING OCCURS:  · Your leg swelling gets worse.  · Your legs get red and you have pain / fever (temperature higher than 100.4ºF / 38ºC).  · You develop any shortness of breath, chest pain or pressure over your heart.  · You develop any shortness of breath, especially if there is pain in your chest when you breathe in.       

## 2016-12-08 NOTE — ED Provider Notes (Signed)
EMERGENCY DEPARTMENT HISTORY AND PHYSICAL EXAM     Physician/Midlevel provider first contact with patient: 12/08/16 0843         Date: 12/08/2016  Patient Name: Brandy Allen    History of Presenting Illness     Chief Complaint   Patient presents with   . Abdominal Pain   . Headache       History Provided By: Patient    Chief Complaint: Headache  Onset: this morning  Timing: constant  Location: frontal  Quality: pressure  Severity: 6/10  Modifying Factors:   Associated Symptoms: blurred vision R eye, resolved. (+) nausea    Additional History: Brandy Allen is a 26 y.o. female with ho migraine, PCOS, DVT, PE who presents with headache since this morning. States headache is of similar quality to past migraines. Reports she had blurred vision to her right eye which lasted for a few minutes and resolved, states this has happened several times with her migraines in the past. (+) nausea. Denies vomiting, dizziness, numbness, weakness, double vision, neck pain, fevers. Has not taken pain medication prior to arrival   Also admits to B/L leg swelling for last 2 days. States this happens frequently and has been worked up for it in the past without any known cause. She denies pain to the legs, chest pain, SOB, cough. 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   Also admits to lower abdominal cramping x 1 week. States feels like typical cramping associated with her menstrual cycle, however menstrual cycle ended a few days ago and cramping continues.     PCP: Pcp, Noneorunknown, MD      No current facility-administered medications for this encounter.      Current Outpatient Prescriptions   Medication Sig Dispense Refill   . metoclopramide (REGLAN) 10 MG tablet Take 1 tablet (10 mg total) by mouth 4 (four) times daily as needed (nausea). 12 tablet 0   . naproxen (NAPROSYN) 500 MG tablet Take 1 tablet (500 mg total) by mouth 2 (two) times daily with meals. 20 tablet 0       Past History     Past Medical History:  Past Medical  History:   Diagnosis Date   . DVT (deep venous thrombosis)    . PCOS (polycystic ovarian syndrome)    . PE (pulmonary embolism)        Past Surgical History:  Past Surgical History:   Procedure Laterality Date   . TONSILLECTOMY         Family History:  No family history on file.    Social History:  Social History   Substance Use Topics   . Smoking status: Former Smoker     Types: Cigarettes   . Smokeless tobacco: Never Used   . Alcohol use Yes       Allergies:  Allergies   Allergen Reactions   . Latex        Review of Systems     Review of Systems   Constitutional: Negative for chills and fever.   HENT: Negative for congestion and sore throat.    Eyes: Positive for blurred vision (R eye, resolved) and photophobia. Negative for double vision.   Respiratory: Negative for shortness of breath.    Cardiovascular: Negative for chest pain.   Gastrointestinal: Positive for abdominal pain (lower abdominal cramping) and nausea. Negative for vomiting.   Genitourinary: Negative for dysuria and frequency.   Musculoskeletal: Negative for myalgias and neck pain.   Skin: Negative for rash.  Neurological: Positive for headaches. Negative for dizziness, tingling, tremors, sensory change, focal weakness and loss of consciousness.         Physical Exam   BP 118/57   Pulse 76   Temp 98.2 F (36.8 C)   Resp 18   Ht 6' (1.829 m)   Wt 158.8 kg   LMP 12/01/2016 (Exact Date)   SpO2 100%   BMI 47.47 kg/m     Physical Exam   Constitutional: She is oriented to person, place, and time. She appears well-developed and well-nourished.  Non-toxic appearance.   Eyes: EOM and lids are normal. Pupils are equal, round, and reactive to light. No scleral icterus. Right eye exhibits no nystagmus. Left eye exhibits no nystagmus.   Neck: Phonation normal. Neck supple.   Cardiovascular: Normal rate, regular rhythm and normal heart sounds.    Pulses:       Radial pulses are 2+ on the right side, and 2+ on the left side.        Posterior tibial  pulses are 2+ on the right side, and 2+ on the left side.   Pulmonary/Chest: Effort normal and breath sounds normal. No respiratory distress. She has no wheezes. She has no rales.   Abdominal: Soft. Normal appearance and bowel sounds are normal. There is no tenderness. There is no rigidity and no guarding.   Musculoskeletal:        Right lower leg: She exhibits no tenderness.        Left lower leg: She exhibits no tenderness.   Generalized B/L lower extremity swelling, non-pitting. No tenderness, no erythema or warmth.    Neurological: She is alert and oriented to person, place, and time. GCS eye subscore is 4. GCS verbal subscore is 5. GCS motor subscore is 6.   Sensation in tact to light touch and equal B/L upper and lower extremities. Strength 5/5 B/L upper and lower extremities. Finger to nose in tact. CN II-XII in tact.    Skin: Skin is warm and dry. She is not diaphoretic.   Nursing note and vitals reviewed.      Diagnostic Study Results     Labs -     Results     Procedure Component Value Units Date/Time    Urine HCG, Qualitative [[id number]] Collected:  12/08/16 0917    Specimen:  Urine Updated:  12/08/16 0950     Urine HCG Qualitative Negative    Comprehensive metabolic panel [[id number]]  (Abnormal) Collected:  12/08/16 0837    Specimen:  Blood Updated:  12/08/16 0931     Glucose 89 mg/dL      BUN 9.0 mg/dL      Creatinine 0.8 mg/dL      Sodium 782 mEq/L      Potassium 4.5 mEq/L      Chloride 105 mEq/L      CO2 28 mEq/L      Calcium 9.1 mg/dL      Protein, Total 7.1 g/dL      Albumin 3.3 (L) g/dL      AST (SGOT) 22 U/L      ALT 26 U/L      Alkaline Phosphatase 76 U/L      Bilirubin, Total 0.2 mg/dL      Globulin 3.8 (H) g/dL      Albumin/Globulin Ratio 0.9     Anion Gap 8.0    Lipase [[id number]] Collected:  12/08/16 0837    Specimen:  Blood Updated:  12/08/16 0931  Lipase 32 U/L     Hemolysis index [[id number]] Collected:  12/08/16 0837     Updated:  12/08/16 0931     Hemolysis Index 10    GFR [[id number]]  Collected:  12/08/16 0837     Updated:  12/08/16 0931     EGFR >60.0    UA, Reflex to Microscopic (pts 3 + yrs) [[id number]]  (Abnormal) Collected:  12/08/16 0838    Specimen:  Urine Updated:  12/08/16 0911     Urine Type Clean Catch     Color, UA Yellow     Clarity, UA Clear     Specific Gravity UA 1.017     Urine pH 6.0     Leukocyte Esterase, UA Negative     Nitrite, UA Negative     Protein, UR Negative     Glucose, UA Negative     Ketones UA Negative     Urobilinogen, UA Negative mg/dL      Bilirubin, UA Negative     Blood, UA Negative     RBC, UA 6 - 10 (A) /hpf      WBC, UA 0 - 5 /hpf      Squamous Epithelial Cells, Urine 0 - 5 /hpf     CBC with differential [[id number]]  (Abnormal) Collected:  12/08/16 0838    Specimen:  Blood from Blood Updated:  12/08/16 0901     WBC 4.59 x10 3/uL      Hgb 11.5 (L) g/dL      Hematocrit 62.9 %      Platelets 324 x10 3/uL      RBC 4.67 x10 6/uL      MCV 80.3 fL      MCH 24.6 (L) pg      MCHC 30.7 (L) g/dL      RDW 17 (H) %      MPV 9.3 (L) fL      Neutrophils 40.1 %      Lymphocytes Automated 51.2 %      Monocytes 6.5 %      Eosinophils Automated 1.5 %      Basophils Automated 0.7 %      Immature Granulocyte 0.0 %      Nucleated RBC 0.0 /100 WBC      Neutrophils Absolute 1.84 x10 3/uL      Abs Lymph Automated 2.35 x10 3/uL      Abs Mono Automated 0.30 x10 3/uL      Abs Eos Automated 0.07 x10 3/uL      Absolute Baso Automated 0.03 x10 3/uL      Absolute Immature Granulocyte 0.00 x10 3/uL      Absolute NRBC 0.00 x10 3/uL           Radiologic Studies -   Radiology Results (24 Hour)     ** No results found for the last 24 hours. **      .      Medical Decision Making   I am the first provider for this patient.    I reviewed the vital signs, available nursing notes, past medical history, past surgical history, family history and social history.    Vital Signs-Reviewed the patient's vital signs.     Patient Vitals for the past 12 hrs:   BP Temp Pulse Resp   12/08/16 1001 118/57 98.2  F (36.8 C) 76 18   12/08/16 0812 (!) 176/91 98.2 F (36.8 C) 80 18       Pulse Oximetry Analysis -  Normal 99% on RA    Old Medical Records: Old medical records.  Nursing notes.     ED Course:   9:50 AM - patient feeling much better. Discussed results and plan for f/u. Agrees with plan.     Provider Notes: 26 y/o F with migraine, without new features today. Pain well-controlled in ED. Low risk symptoms as not sudden onset, no head trauma, normal neurological exam, no fever or neck stiffness, and thus low suspicion for [facility], traumatic hematoma, ICH, CVA, meningitis, glaucoma at this time. Patient also reports B/L leg swelling which she reports she has had intermittently for months and has been worked up as outpatient without known cause. Noted h/o dvt/PE, however patient without any leg pain or tenderness, no CP or SOB, not tachycardic. Admits to lower abdominal cramping since onset of her menstrual cycle. Belly soft and completely non-tender. Given rx for naprosyn, reglan for home as needed for HA and nausea, counseled to f/u with PCP, neuro. Return precautions discussed. Case d/w Dr Karren Burly.       Procedures:    Core Measures:    Critical Care Time:     Diagnosis     Clinical Impression:   1. Other migraine without status migrainosus, not intractable    2. Swelling of lower extremity    3. Abdominal cramping        _______________________________                 Clarisa Schools, PA  12/08/16 1356       Celso Amy, MD  12/09/16 1334

## 2019-02-06 DIAGNOSIS — Z86711 Personal history of pulmonary embolism: Secondary | ICD-10-CM | POA: Insufficient documentation

## 2019-04-20 DIAGNOSIS — E669 Obesity, unspecified: Secondary | ICD-10-CM | POA: Insufficient documentation

## 2019-08-08 DIAGNOSIS — R102 Pelvic and perineal pain: Secondary | ICD-10-CM | POA: Insufficient documentation

## 2019-08-08 DIAGNOSIS — G8929 Other chronic pain: Secondary | ICD-10-CM | POA: Insufficient documentation

## 2019-10-01 HISTORY — PX: BARIATRIC SURGERY: SHX1103

## 2020-03-23 DIAGNOSIS — I8290 Acute embolism and thrombosis of unspecified vein: Secondary | ICD-10-CM | POA: Insufficient documentation

## 2020-03-23 DIAGNOSIS — I2699 Other pulmonary embolism without acute cor pulmonale: Secondary | ICD-10-CM | POA: Insufficient documentation

## 2020-04-06 DIAGNOSIS — F411 Generalized anxiety disorder: Secondary | ICD-10-CM | POA: Insufficient documentation

## 2020-04-06 DIAGNOSIS — F339 Major depressive disorder, recurrent, unspecified: Secondary | ICD-10-CM | POA: Insufficient documentation

## 2020-10-13 ENCOUNTER — Ambulatory Visit
Admission: RE | Admit: 2020-10-13 | Discharge: 2020-10-13 | Disposition: A | Discharge status: Home / Self Care | Payer: No Typology Code available for payment source | Source: Ambulatory Visit | Attending: Obstetrics and Gynecology | Admitting: Obstetrics and Gynecology

## 2020-10-13 ENCOUNTER — Other Ambulatory Visit: Payer: Self-pay | Admitting: Obstetrics and Gynecology

## 2020-10-13 ENCOUNTER — Other Ambulatory Visit: Payer: Self-pay

## 2020-10-13 DIAGNOSIS — R7611 Nonspecific reaction to tuberculin skin test without active tuberculosis: Secondary | ICD-10-CM

## 2020-11-22 ENCOUNTER — Emergency Department (HOSPITAL_COMMUNITY): Payer: 59

## 2020-11-22 ENCOUNTER — Emergency Department (HOSPITAL_COMMUNITY)
Admission: EM | Admit: 2020-11-22 | Discharge: 2020-11-22 | Disposition: A | Discharge status: Home / Self Care | Payer: 59 | Attending: Emergency Medicine | Admitting: Emergency Medicine

## 2020-11-22 ENCOUNTER — Other Ambulatory Visit: Payer: Self-pay

## 2020-11-22 ENCOUNTER — Encounter (HOSPITAL_COMMUNITY): Payer: Self-pay | Admitting: *Deleted

## 2020-11-22 DIAGNOSIS — R197 Diarrhea, unspecified: Secondary | ICD-10-CM | POA: Diagnosis not present

## 2020-11-22 DIAGNOSIS — R112 Nausea with vomiting, unspecified: Secondary | ICD-10-CM

## 2020-11-22 DIAGNOSIS — Z20822 Contact with and (suspected) exposure to covid-19: Secondary | ICD-10-CM | POA: Diagnosis not present

## 2020-11-22 DIAGNOSIS — R413 Other amnesia: Secondary | ICD-10-CM

## 2020-11-22 DIAGNOSIS — F17290 Nicotine dependence, other tobacco product, uncomplicated: Secondary | ICD-10-CM | POA: Diagnosis not present

## 2020-11-22 HISTORY — DX: Hallucinations, unspecified: R44.3

## 2020-11-22 HISTORY — DX: Other pulmonary embolism without acute cor pulmonale: I26.99

## 2020-11-22 HISTORY — DX: Acute embolism and thrombosis of unspecified deep veins of unspecified lower extremity: I82.409

## 2020-11-22 LAB — COMPREHENSIVE METABOLIC PANEL
ALT: 14 U/L (ref 0–44)
AST: 14 U/L — ABNORMAL LOW (ref 15–41)
Albumin: 3.7 g/dL (ref 3.5–5.0)
Alkaline Phosphatase: 62 U/L (ref 38–126)
Anion gap: 8 (ref 5–15)
BUN: 5 mg/dL — ABNORMAL LOW (ref 6–20)
CO2: 24 mmol/L (ref 22–32)
Calcium: 9.3 mg/dL (ref 8.9–10.3)
Chloride: 107 mmol/L (ref 98–111)
Creatinine, Ser: 0.99 mg/dL (ref 0.44–1.00)
GFR, Estimated: >60 mL/min (ref >60)
Glucose, Bld: 92 mg/dL (ref 70–99)
Potassium: 4.3 mmol/L (ref 3.5–5.1)
Sodium: 139 mmol/L (ref 135–145)
Total Bilirubin: 0.9 mg/dL (ref 0.3–1.2)
Total Protein: 7.5 g/dL (ref 6.5–8.1)

## 2020-11-22 LAB — CBC
HCT: 41.6 % (ref 36.0–46.0)
Hemoglobin: 12.1 g/dL (ref 12.0–15.0)
MCH: 25 pg — ABNORMAL LOW (ref 26.0–34.0)
MCHC: 29.1 g/dL — ABNORMAL LOW (ref 30.0–36.0)
MCV: 86 fL (ref 80.0–100.0)
Platelets: 334 10*3/uL (ref 150–400)
RBC: 4.84 MIL/uL (ref 3.87–5.11)
RDW: 15.3 % (ref 11.5–15.5)
WBC: 4.8 10*3/uL (ref 4.0–10.5)
nRBC: 0 % (ref 0.0–0.2)

## 2020-11-22 LAB — URINALYSIS, ROUTINE W REFLEX MICROSCOPIC
Bilirubin Urine: NEGATIVE
Glucose, UA: NEGATIVE mg/dL
Ketones, ur: NEGATIVE mg/dL
Leukocytes,Ua: NEGATIVE
Nitrite: NEGATIVE
Protein, ur: NEGATIVE mg/dL
Specific Gravity, Urine: 1.019 (ref 1.005–1.030)
pH: 5 (ref 5.0–8.0)

## 2020-11-22 LAB — I-STAT BETA HCG BLOOD, ED (MC, WL, AP ONLY): I-stat hCG, quantitative: <5 m[IU]/mL (ref <5)

## 2020-11-22 LAB — SARS CORONAVIRUS 2 (TAT 6-24 HRS): SARS Coronavirus 2: NEGATIVE

## 2020-11-22 LAB — LIPASE, BLOOD: Lipase: 23 U/L (ref 11–51)

## 2020-11-22 MED ORDER — ONDANSETRON 4 MG PO TBDP: 4.0000 mg | ORAL_TABLET | Freq: Three times a day (TID) | ORAL | 0 refills | Status: DC | PRN

## 2020-11-22 MED ORDER — BENZONATATE 100 MG PO CAPS: 100.0000 mg | ORAL_CAPSULE | Freq: Three times a day (TID) | ORAL | 0 refills | Status: DC

## 2020-11-22 NOTE — ED Provider Notes (Signed)
[place] EMERGENCY DEPARTMENT Provider Note   CSN: [id number] Arrival date & time: 11/22/20  1016     History Chief Complaint  Patient presents with  . Memory Loss    Nina Morrow is a 30 y.o. female with a hx of bipolar disorder & anxiety who presents to the ED with multiple complaints today. Patient states she is primarily here with concern for memory issues over the past 4 months. Patient states she is having memory issues described as her friends and family telling her statements she has made or actions she has performed that she has no recollection of. She has had 3 episodes over the past 4 months where she will be performing an activity and "black out" and does not recall what occurred briefly when she resumes the activity- for example she was driving 80 mph then all of the sudden was driving 60 mph and is unsure what happened in between. She denies diplopia, loss of vision, numbness, weakness, chest pain ,or dyspnea. She has also felt sick for the past 1 week with nausea, vomiting, diarrhea, chills, and cough.  No alleviating or any factors to the symptoms. She denies fever, abdominal pain, hematemesis, melena, hematochezia, hemoptysis, chest pain, or dyspnea. Denies SI, HI, or hallucinations. She has not been completely compliant with her medications, she is not taking her lithium regularly.  HPI     Past Medical History:  Diagnosis Date  . DVT (deep venous thrombosis) ([place])   . Hallucination   . Pulmonary emboli ([facility])     There are no problems to display for this patient.   Past Surgical History:  Procedure Laterality Date  . BARIATRIC SURGERY       OB History   No obstetric history on file.     No family history on file.  Social History   Tobacco Use  . Smoking status: Current Every Day Smoker    Types: E-cigarettes  . Smokeless tobacco: Never Used  Substance Use Topics  . Alcohol use: Yes  . Drug use: Never    Home  Medications Prior to Admission medications   Not on File    Allergies    Patient has no known allergies.  Review of Systems   Review of Systems  Constitutional: Positive for chills. Negative for fever.  Eyes: Negative for visual disturbance.  Respiratory: Positive for cough. Negative for shortness of breath.   Cardiovascular: Negative for chest pain and leg swelling.  Gastrointestinal: Positive for diarrhea, nausea and vomiting. Negative for abdominal pain.  Neurological: Positive for syncope ("black out episodes"). Negative for speech difficulty, weakness, numbness and headaches.       Positive for memory issues.   Psychiatric/Behavioral: Negative for hallucinations and suicidal ideas.  All other systems reviewed and are negative.  Physical Exam Updated Vital Signs BP 126/73 (BP Location: Left Arm)   Pulse 62   Temp 98.3 F (36.8 C) (Oral)   Resp 14   LMP 10/15/2020 (Exact Date)   SpO2 100%   Physical Exam Vitals and nursing note reviewed.  Constitutional:      General: She is not in acute distress.    Appearance: Normal appearance. She is not toxic-appearing.  HENT:     Head: Normocephalic and atraumatic.     Mouth/Throat:     Pharynx: Oropharynx is clear. Uvula midline.  Eyes:     General: Vision grossly intact. Gaze aligned appropriately.     Extraocular Movements: Extraocular movements intact.  Conjunctiva/sclera: Conjunctivae normal.     Pupils: Pupils are equal, round, and reactive to light.     Comments: No proptosis.   Cardiovascular:     Rate and Rhythm: Normal rate and regular rhythm.  Pulmonary:     Effort: Pulmonary effort is normal.     Breath sounds: Normal breath sounds.  Abdominal:     General: There is no distension.     Palpations: Abdomen is soft.     Tenderness: There is no abdominal tenderness. There is no guarding or rebound.  Musculoskeletal:     Cervical back: Normal range of motion and neck supple. No rigidity.  Skin:    General:  Skin is warm and dry.  Neurological:     Mental Status: She is alert.     Comments: Alert. Clear speech. Oriented x 4. No facial droop. CNIII-XII grossly intact. Bilateral upper and lower extremities' sensation grossly intact. 5/5 symmetric strength with grip strength and with plantar and dorsi flexion bilaterally . Normal finger to nose bilaterally. Negative pronator drift. Gait intact.    Psychiatric:        Mood and Affect: Mood normal.        Behavior: Behavior normal.    ED Results / Procedures / Treatments   Labs (all labs ordered are listed, but only abnormal results are displayed) Labs Reviewed  COMPREHENSIVE METABOLIC PANEL - Abnormal; Notable for the following components:      Result Value   BUN 5 (*)    AST 14 (*)    All other components within normal limits  CBC - Abnormal; Notable for the following components:   MCH 25.0 (*)    MCHC 29.1 (*)    All other components within normal limits  URINALYSIS, ROUTINE W REFLEX MICROSCOPIC - Abnormal; Notable for the following components:   Hgb urine dipstick SMALL (*)    Bacteria, UA RARE (*)    All other components within normal limits  SARS CORONAVIRUS 2 (TAT 6-24 HRS)  LIPASE, BLOOD  I-STAT BETA HCG BLOOD, ED ([facility], [facility], [facility] ONLY)    EKG EKG Interpretation  Date/Time:  Tuesday November 22 2020 22:43:20 EST Ventricular Rate:  51 PR Interval:  160 QRS Duration: 96 QT Interval:  438 QTC Calculation: 403 R Axis:   87 Text Interpretation: Sinus bradycardia with Premature atrial complexes in a pattern of bigeminy Otherwise normal ECG Confirmed by Madalyn Rob [phone] on 11/22/2020 10:54:04 PM   Radiology DG Chest 2 View  Result Date: 11/22/2020 CLINICAL DATA:  Cough, shortness of breath, weakness, lightheadedness, fever, and upper back pain for 1 week. COVID test was negative today. EXAM: CHEST - 2 VIEW COMPARISON:  10/13/2020 FINDINGS: The heart size and mediastinal contours are within normal limits. Both lungs are clear.  The visualized skeletal structures are unremarkable. IMPRESSION: No active cardiopulmonary disease. Electronically Signed   By: Lucienne Capers M.D.   On: 11/22/2020 21:55   CT Head Wo Contrast  Result Date: 11/22/2020 CLINICAL DATA:  30 year old female with memory loss. EXAM: CT HEAD WITHOUT CONTRAST TECHNIQUE: Contiguous axial images were obtained from the base of the skull through the vertex without intravenous contrast. COMPARISON:  None. FINDINGS: Brain: No evidence of acute infarction, hemorrhage, hydrocephalus, extra-axial collection or mass lesion/mass effect. Vascular: No hyperdense vessel or unexpected calcification. Skull: Normal. Negative for fracture or focal lesion. Sinuses/Orbits: No acute finding. Other: None IMPRESSION: Normal noncontrast CT of the brain. Electronically Signed   By: Anner Crete M.D.   On:  11/22/2020 21:55    Procedures Procedures   Medications Ordered in ED Medications - No data to display  ED Course  I have reviewed the triage vital signs and the nursing notes.  Pertinent labs & imaging results that were available during my care of the patient were reviewed by me and considered in my medical decision making (see chart for details).    MDM Rules/Calculators/A&P                         Patient presents to the ED with complaints of memory issues intermittently x 4 months and chills, cough, & N/V/D x 1 week. She is nontoxic, resting comfortably, vitals without significant abnormality.   Additional history obtained:  Additional history obtained from chart review & nursing note review.   EKG: Sinus bradycardia with Premature atrial complexes in a pattern of bigeminy Otherwise normal ECG  Lab Tests:  I reviewed and interpreted labs, which included:  CBC: Unremarkable, no significant anemia.  CMP: no significant electrolyte derangement.  Lipase: WNL Preg test: Negative.  UA: No UTI.  COVID: Negative  Imaging Studies ordered:  I ordered imaging  studies which included CT Head & CXR, I independently visualized and interpreted imaging which showed no acute abnormalities.   Patient presenting with multiple concerns - Memory issues/"blacking out" episodes intermittently x 4 months- No focal neuro deficits, CT head wo abnormality, w/ sxs for this length of time and no infarct on CT I doubt ischemic CVA, also no signs of bleed or mass. No significant anemia or electrolyte derangement. She has not been taking her lithium regularly- did not have it the past 3 nights- doubt lithium toxicity. EKG without significant arrhythmia, has PAC in bigeminy. Appears appropriate for discharge from the ED at this time, however with her symptoms feel she would benefit from close neurology follow up- ambulatory referral placed. We discussed not driving/operating heavy machinery in the interim. Referral placed & neurology office information provided.   - Chills/N/V/D/cough: COVID negative. CXR w/o infiltrate to suggest pneumonia. Labs reassuring. No chest pain/dyspnea. No respiratory distress. Abdomen nontender w/o peritoneal signs- doubt acute surgical process. Tolerating PO. Suspect viral. Appears appropriate for discharge with supportive care & PCP follow up.   I discussed results, treatment plan, need for follow-up, and return precautions with the patient. Provided opportunity for questions, patient confirmed understanding and is in agreement with plan.   Findings and plan of care discussed with supervising physician Dr. Roslynn Amble who is in agreement.   Portions of this note were generated with [employer]. Dictation errors may occur despite best attempts at proofreading.  Final Clinical Impression(s) / ED Diagnoses Final diagnoses:  Memory problem  Nausea vomiting and diarrhea    Rx / DC Orders ED Discharge Orders         Ordered    ondansetron (ZOFRAN ODT) 4 MG disintegrating tablet  Every 8 hours PRN        11/22/20 2312    benzonatate  (TESSALON) 100 MG capsule  Every 8 hours        11/22/20 2312    Ambulatory referral to Neurology       Comments: An appointment is requested in approximately: 1 week   11/22/20 2313           Amaryllis Dyke, PA-C 11/22/20 2325    Lucrezia Starch, MD 11/23/20 [phone]

## 2020-11-22 NOTE — ED Triage Notes (Signed)
Pt has been experiencing memory issues since October and progressively worse. She said it is things she would never say to him and doing things she would never do. Pt states she passed out this am and yesterday.  Pt states she did not hit her head. Pt states that she has been having vomiting and diarrhea with bilateral lower abdominal pain. Pain has history of visual hallucinations, but does not experience that now, she is on medications.

## 2020-11-22 NOTE — Discharge Instructions (Addendum)
You were seen in the emergency department today for memory issues.  Your blood work was overall reassuring.  Your head CT and chest x-ray were normal.  Your Covid test was negative.  We are sending you home with Tessalon to take every hours as needed for coughing and Zofran to take every 8 hours as needed for nausea and vomiting.  Please be sure to take all of your prescribed medications as prescribed.  We have prescribed you new medication(s) today. Discuss the medications prescribed today with your pharmacist as they can have adverse effects and interactions with your other medicines including over the counter and prescribed medications. Seek medical evaluation if you start to experience new or abnormal symptoms after taking one of these medicines, seek care immediately if you start to experience difficulty breathing, feeling of your throat closing, facial swelling, or rash as these could be indications of a more serious allergic reaction  We would like you to follow-up the primary care provider within 3 days for general reassessment of your symptoms.  We would like you to follow-up with neurology within 1 week to review memory issues. If you are having blackout episodes you should not drive a car or operate heavy machinery until cleared by neurology. We have placed a referral to neurology.  Return to the ER for any new or worsening symptoms including but not limited to numbness/weakness to one side of your body, loss of your vision, double vision, fever, inability to keep fluids down, chest pain, trouble breathing, or any other concerns.

## 2020-11-22 NOTE — ED Triage Notes (Signed)
Pt has complaint of cough now too.

## 2020-11-22 NOTE — ED Notes (Signed)
Called twice and no answer.

## 2021-02-01 ENCOUNTER — Telehealth: Payer: Self-pay | Admitting: Physician Assistant

## 2021-02-01 NOTE — Telephone Encounter (Signed)
Received a new hem referral from [facility] for blood clot disorder, unknown, on Eliquis. Pt has been cld and scheduled to see Murray Hodgkins on 4/8 at 11am. Pt aware to arrive 20 minutes early.

## 2021-02-02 NOTE — Progress Notes (Signed)
[place] Telephone:([phone]   Fax:([phone]) [place] NOTE  Patient Care Team: Trey Sailors, PA as PCP - General (Physician Assistant)  Hematological/Oncological History 1) 06/2010: Right leg DVT and PE treated with warfarin x 3 months-felt to be provoked by OCPs 2) 2015: Left Leg DVT. Felt to be provoked by OCPs. Recommended indefinite AC and treated with warfarin. Patient self-discontinued after 8months 3) 12/2017: Right leg DVT and PE treated with warfarin-no provoking factor since not on OCPS.  4) 03/14/2020: Checked Factor V Leiden mutation, Prothrombin Mutation, Lupus anticoagulant-Not present 5) 03/30/2020: Switch to Eliquis.  6) 10/2020: Discontinued Eliquis due to cost.  7) 02/02/2021: Establish care with [name] PA-C  CHIEF COMPLAINTS/PURPOSE OF CONSULTATION:  "DVTs/PEs"  HISTORY OF PRESENTING ILLNESS:  Nina Morrow 30 y.o. female with medical history significant for multiple DVTs/PEs, PCOS, Bipolar disorder and status post laparoscopic sleeve gastrectomy. Patient is unaccompanied for this visit.   On review of the previous records, Ms. 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 The first two events were felt to be secondary to OCPs and treated with warfarin. After the third event in March 2019, which was thought to be unprovoked, patient was advised to be on anticoagulation indefinitely. Patient remained on warfarin until June 2021 when she switched to Eliquis. Patient discontinued Eliquis in January 2022 due to high cost of the medication. She has yet to resume anticoagulation.   On exam today, Ms. Weyenberg reports significant fatigue that has been progressively worsening the last several months. She requires frequent resting in bed when she is not working or running errands. Patient admits to being more active in the past including exercising which she no longer can do. She reports having a syncopal episode  yesterday and two other episodes while driving in the past month. She has a good appetite without any noticeable weight changes. Patient has heavy periods lasting 10 day. Her heaviest days of bleeding are the first 5 days, which requires using a tampon and 2 pads every 3 hours. She recently switched out her mirena IUD in March 2022. She is considering a hysterectomy as she is unable to go on OCPs due to history of DVTs and PE's. Patient reports chronic nausea without any vomiting episodes. She has lower pelvic pain that has been chronic. She reports constipation that improves with daily intake of milk of magnesia. She denies any hematochezia or melena. Patient has progressive shortness of breath even at rest. She denies any fevers, chills, chest pain or cough. She has no other complaints. Rest of the 10 point ROS is below.   MEDICAL HISTORY:  Past Medical History:  Diagnosis Date  . Bipolar 1 disorder ([place])    with depression and anxiety. stopped taking lithium december 2021.   Marland Kitchen DVT (deep venous thrombosis) ([place])   . Hallucination   . PCOS (polycystic ovarian syndrome)   . Pulmonary emboli ([place])     SURGICAL HISTORY: Past Surgical History:  Procedure Laterality Date  . BARIATRIC SURGERY    . TONSILLECTOMY      SOCIAL HISTORY: Social History   Socioeconomic History  . Marital status: Single    Spouse name: Not on file  . Number of children: Not on file  . Years of education: Not on file  . Highest education level: Not on file  Occupational History  . Not on file  Tobacco Use  . Smoking status: Former Smoker    Years: 2.00  Types: E-cigarettes, Cigarettes, Cigars    Quit date: 02/04/2012    Years since quitting: 9.0  . Smokeless tobacco: Never Used  . Tobacco comment: Used to smoke 1 pack a month  Substance and Sexual Activity  . Alcohol use: Yes    Comment: socially. 1-2 drinks/month  . Drug use: Yes    Types: Marijuana    Comment: vapes THC  . Sexual activity: Not on  file  Other Topics Concern  . Not on file  Social History Narrative  . Not on file   Social Determinants of Health   Financial Resource Strain: Not on file  Food Insecurity: Not on file  Transportation Needs: Not on file  Physical Activity: Not on file  Stress: Not on file  Social Connections: Not on file  Intimate Partner Violence: Not on file    FAMILY HISTORY: Family History  Problem Relation Age of Onset  . Transient ischemic attack Mother        77  . Pulmonary embolism Sister        May 2021  . Breast cancer Maternal Grandmother   . Stomach cancer Maternal Grandfather   . Lung cancer Maternal Grandfather   . Breast cancer Cousin     ALLERGIES:  is allergic to aripiprazole, grass extracts [gramineae pollens], and latex.  MEDICATIONS:  Current Outpatient Medications  Medication Sig Dispense Refill  . albuterol (VENTOLIN HFA) 108 (90 Base) MCG/ACT inhaler Inhale into the lungs. prn    . levonorgestrel (MIRENA) 20 MCG/24HR IUD     . RIVAROXABAN (XARELTO) VTE STARTER PACK (15 & 20 MG) Follow package directions: Take one 15mg  tablet by mouth twice a day. On day 22, switch to one 20mg  tablet once a day. Take with food. 51 each 0  . pantoprazole (PROTONIX) 40 MG tablet Take 1 tablet by mouth daily.    Marland Kitchen PREMARIN vaginal cream Place vaginally.     No current facility-administered medications for this visit.    REVIEW OF SYSTEMS:   Constitutional: ( - ) fevers, ( - )  chills , ( + ) night sweats Eyes: ( - ) blurriness of vision, ( - ) double vision, ( - ) watery eyes Ears, nose, mouth, throat, and face: ( - ) mucositis, ( - ) sore throat Respiratory: ( - ) cough, ( + ) dyspnea, ( - ) wheezes Cardiovascular: ( - ) palpitation, ( - ) chest discomfort, ( - ) lower extremity swelling Gastrointestinal:  (+ ) nausea, ( - ) heartburn, ( - ) change in bowel habits Skin: ( - ) abnormal skin rashes Lymphatics: ( - ) new lymphadenopathy, ( - ) easy bruising Neurological: ( - )  numbness, ( - ) tingling, ( - ) new weaknesses Behavioral/Psych: ( - ) mood change, ( - ) new changes  All other systems were reviewed with the patient and are negative.  PHYSICAL EXAMINATION: ECOG PERFORMANCE STATUS: 1 - Symptomatic but completely ambulatory  Vitals:   02/03/21 1052  BP: 120/73  Pulse: (!) 103  Resp: 17  Temp: (!) 97.2 F (36.2 C)  SpO2: 100%   Filed Weights   02/03/21 1052  Weight: 257 lb (116.6 kg)    GENERAL: well appearing african [place] female in NAD  SKIN: skin color, texture, turgor are normal, no rashes or significant lesions EYES: conjunctiva are pink and non-injected, sclera clear OROPHARYNX: no exudate, no erythema; lips, buccal mucosa, and tongue normal  NECK: supple, non-tender LYMPH:  no palpable lymphadenopathy in the  cervical, axillary or supraclavicular lymph nodes.  LUNGS: clear to auscultation and percussion with normal breathing effort HEART: regular rate & rhythm and no murmurs and no lower extremity edema ABDOMEN: soft, non-tender, non-distended, normal bowel sounds Musculoskeletal: no cyanosis of digits and no clubbing  PSYCH: alert & oriented x 3, fluent speech NEURO: no focal motor/sensory deficits  LABORATORY DATA:  I have reviewed the data as listed CBC Latest Ref Rng & Units 02/03/2021 11/22/2020  WBC 4.0 - 10.5 K/uL 3.5(L) 4.8  Hemoglobin 12.0 - 15.0 g/dL 12.0 12.1  Hematocrit 36.0 - 46.0 % 39.0 41.6  Platelets 150 - 400 K/uL 325 334    CMP Latest Ref Rng & Units 02/03/2021 11/22/2020  Glucose 70 - 99 mg/dL 87 92  BUN 6 - 20 mg/dL 7 5(L)  Creatinine 0.44 - 1.00 mg/dL 0.84 0.99  Sodium 135 - 145 mmol/L 140 139  Potassium 3.5 - 5.1 mmol/L 4.0 4.3  Chloride 98 - 111 mmol/L 104 107  CO2 22 - 32 mmol/L 24 24  Calcium 8.9 - 10.3 mg/dL 8.8(L) 9.3  Total Protein 6.5 - 8.1 g/dL 8.1 7.5  Total Bilirubin 0.3 - 1.2 mg/dL 0.6 0.9  Alkaline Phos 38 - 126 U/L 80 62  AST 15 - 41 U/L 16 14(L)  ALT 0 - 44 U/L 15 14   ASSESSMENT &  PLAN Latrell Reitan is a 30 y.o. female presenting to the clinic for evaluation for anticoagulation due to history of multiple DVT's and PE's. Patient was previously on anticoagulation with Eliquis but discontinued in January 2022 due to cost.  We reviewed hypercoagulable workup that was completed by [facility] in May 2021 which included Factor V Leiden mutation, Prothrombin Mutation, Lupus anticoagulant, all three were unremarkable.   We recommend additional workup which includes protein C and protein S activity, beta-2 glycoprotein antibody and cardiolipin antibody.   Due to recurrent history of DVT's and PE's, the recommendation is indefinite anticoagulation. Patient prefers not to take Warfarin due to frequent INR checks and she cannot afford Eliquis. We will send a test script for Xarelto and reach out our financial navigators for copay assistance.   #Multiple DVT's and PE's: --Check labs today with CBC, CMP, Protein C and S activity, cardiolipin antibody, and beta-2 glycoprotein antibody --Recommend indefinite anticoagulation due to recurrent DVT/PEs. Sent Xarelto prescription and will refer patient to financial navigator for copay assistance.   #Iron deficiency anemia 2/2 to menstrual bleeding: --Iron panel from [facility] on 01/20/2021 revealed IDA with total iron 36, iron saturation 8%, ferritin 4.  --Check labs today with Retic panel.  --Patient has history of gastric sleeve surgery so she unlikely can absorb iron through diet or through PO supplementation.  --Patient has mirena IUD in place with no difference in menstrual bleeding. Advised to follow up with OB/GYN for further recommendations. Patient has expressed desire to undergo hysterectomy since she does not want any children. It is a reasonable option considering severe iron deficiency and limitations to contraceptives due to history of DVT/PE.  --Recommend IV feraheme x 2 doses.   Follow up: -1 week: IV  feraheme infusion -2 weeks: IV feraheme infusion -4 weeks: RTC with labs   Orders Placed This Encounter  Procedures  . CBC with Differential (Cancer Center Only)    Standing Status:   Future    Number of Occurrences:   1    Standing Expiration Date:   02/02/2022  . CMP ([place] only)  Standing Status:   Future    Number of Occurrences:   1    Standing Expiration Date:   02/02/2022  . Beta-2-glycoprotein i abs, IgG/M/A    Standing Status:   Future    Number of Occurrences:   1    Standing Expiration Date:   02/02/2022  . Cardiolipin antibodies, IgG, IgM, IgA*    Standing Status:   Future    Number of Occurrences:   1    Standing Expiration Date:   02/02/2022  . Protein C, total*    Standing Status:   Future    Number of Occurrences:   1    Standing Expiration Date:   02/02/2022  . Lupus anticoagulant panel*    Standing Status:   Future    Number of Occurrences:   1    Standing Expiration Date:   02/03/2022  . PROTEIN S PANEL, Total, Free, Functional Protein S    Standing Status:   Future    Number of Occurrences:   1    Standing Expiration Date:   02/03/2022  . Retic Panel    Standing Status:   Future    Number of Occurrences:   1    Standing Expiration Date:   02/03/2022    All questions were answered. The patient knows to call the clinic with any problems, questions or concerns.  A total of more than 60 minutes were spent on this encounter and over half of that time was spent on counseling and coordination of care as outlined above.    [name], PA-C Department of Hematology/Oncology [place] at [facility] Phone: [phone]  Patient was seen with Dr. Lorenso Courier.   I have read the above note and personally examined the patient. I agree with the assessment and plan as noted above.  Briefly Ms. Johnika Escareno is a 30 year old female with medical history significant for a gastric sleeve who presents for evaluation of recurrent DVTs and iron  deficiency anemia.  The patient's iron deficiency anemia is thought to be secondary to poor absorption due to her gastric sleeve.  While she is here we will check her for other nutritional deficiencies which coincided with gastric bypass surgery.  As for her DVTs we are currently planning to start her on Xarelto (with a sleeve gastrectomy there is thought to be therapeutic levels of the drug still absorbed (Surg Obes Relat Dis. 2018 Dec;14(12):1890-1896.)).    Ledell Peoples, MD Department of Hematology/Oncology [place] at [facility] Phone: [phone] Pager: [phone] Email: [email or web address]

## 2021-02-03 ENCOUNTER — Encounter: Payer: Self-pay | Admitting: Physician Assistant

## 2021-02-03 ENCOUNTER — Encounter: Payer: Self-pay | Admitting: *Deleted

## 2021-02-03 ENCOUNTER — Other Ambulatory Visit: Payer: Self-pay

## 2021-02-03 ENCOUNTER — Inpatient Hospital Stay: Payer: 59

## 2021-02-03 ENCOUNTER — Inpatient Hospital Stay: Payer: 59 | Attending: Physician Assistant | Admitting: Physician Assistant

## 2021-02-03 VITALS — BP 120/73 | HR 103 | Temp 97.2°F | Resp 17 | Wt 257.0 lb

## 2021-02-03 DIAGNOSIS — E282 Polycystic ovarian syndrome: Secondary | ICD-10-CM | POA: Diagnosis not present

## 2021-02-03 DIAGNOSIS — N920 Excessive and frequent menstruation with regular cycle: Secondary | ICD-10-CM | POA: Diagnosis not present

## 2021-02-03 DIAGNOSIS — R0602 Shortness of breath: Secondary | ICD-10-CM

## 2021-02-03 DIAGNOSIS — Z86711 Personal history of pulmonary embolism: Secondary | ICD-10-CM | POA: Diagnosis present

## 2021-02-03 DIAGNOSIS — Z803 Family history of malignant neoplasm of breast: Secondary | ICD-10-CM | POA: Diagnosis not present

## 2021-02-03 DIAGNOSIS — D500 Iron deficiency anemia secondary to blood loss (chronic): Secondary | ICD-10-CM | POA: Diagnosis not present

## 2021-02-03 DIAGNOSIS — Z86718 Personal history of other venous thrombosis and embolism: Secondary | ICD-10-CM | POA: Diagnosis not present

## 2021-02-03 DIAGNOSIS — R110 Nausea: Secondary | ICD-10-CM | POA: Diagnosis not present

## 2021-02-03 DIAGNOSIS — Z800 Family history of malignant neoplasm of digestive organs: Secondary | ICD-10-CM | POA: Insufficient documentation

## 2021-02-03 DIAGNOSIS — Z801 Family history of malignant neoplasm of trachea, bronchus and lung: Secondary | ICD-10-CM | POA: Diagnosis not present

## 2021-02-03 DIAGNOSIS — Z793 Long term (current) use of hormonal contraceptives: Secondary | ICD-10-CM | POA: Diagnosis not present

## 2021-02-03 DIAGNOSIS — F319 Bipolar disorder, unspecified: Secondary | ICD-10-CM | POA: Diagnosis not present

## 2021-02-03 DIAGNOSIS — R102 Pelvic and perineal pain: Secondary | ICD-10-CM | POA: Insufficient documentation

## 2021-02-03 DIAGNOSIS — Z823 Family history of stroke: Secondary | ICD-10-CM | POA: Diagnosis not present

## 2021-02-03 DIAGNOSIS — R5383 Other fatigue: Secondary | ICD-10-CM | POA: Insufficient documentation

## 2021-02-03 DIAGNOSIS — Z836 Family history of other diseases of the respiratory system: Secondary | ICD-10-CM

## 2021-02-03 DIAGNOSIS — D509 Iron deficiency anemia, unspecified: Secondary | ICD-10-CM | POA: Insufficient documentation

## 2021-02-03 DIAGNOSIS — R61 Generalized hyperhidrosis: Secondary | ICD-10-CM

## 2021-02-03 DIAGNOSIS — Z79899 Other long term (current) drug therapy: Secondary | ICD-10-CM | POA: Diagnosis not present

## 2021-02-03 DIAGNOSIS — Z87891 Personal history of nicotine dependence: Secondary | ICD-10-CM | POA: Diagnosis not present

## 2021-02-03 DIAGNOSIS — K5900 Constipation, unspecified: Secondary | ICD-10-CM

## 2021-02-03 LAB — CBC WITH DIFFERENTIAL (CANCER CENTER ONLY)
Abs Immature Granulocytes: 0 10*3/uL (ref 0.00–0.07)
Basophils Absolute: 0 10*3/uL (ref 0.0–0.1)
Basophils Relative: 1 %
Eosinophils Absolute: 0.1 10*3/uL (ref 0.0–0.5)
Eosinophils Relative: 3 %
HCT: 39 % (ref 36.0–46.0)
Hemoglobin: 12 g/dL (ref 12.0–15.0)
Immature Granulocytes: 0 %
Lymphocytes Relative: 47 %
Lymphs Abs: 1.7 10*3/uL (ref 0.7–4.0)
MCH: 24.2 pg — ABNORMAL LOW (ref 26.0–34.0)
MCHC: 30.8 g/dL (ref 30.0–36.0)
MCV: 78.6 fL — ABNORMAL LOW (ref 80.0–100.0)
Monocytes Absolute: 0.2 10*3/uL (ref 0.1–1.0)
Monocytes Relative: 7 %
Neutro Abs: 1.5 10*3/uL — ABNORMAL LOW (ref 1.7–7.7)
Neutrophils Relative %: 42 %
Platelet Count: 325 10*3/uL (ref 150–400)
RBC: 4.96 MIL/uL (ref 3.87–5.11)
RDW: 16.7 % — ABNORMAL HIGH (ref 11.5–15.5)
WBC Count: 3.5 10*3/uL — ABNORMAL LOW (ref 4.0–10.5)
nRBC: 0 % (ref 0.0–0.2)

## 2021-02-03 LAB — CMP (CANCER CENTER ONLY)
ALT: 15 U/L (ref 0–44)
AST: 16 U/L (ref 15–41)
Albumin: 3.9 g/dL (ref 3.5–5.0)
Alkaline Phosphatase: 80 U/L (ref 38–126)
Anion gap: 12 (ref 5–15)
BUN: 7 mg/dL (ref 6–20)
CO2: 24 mmol/L (ref 22–32)
Calcium: 8.8 mg/dL — ABNORMAL LOW (ref 8.9–10.3)
Chloride: 104 mmol/L (ref 98–111)
Creatinine: 0.84 mg/dL (ref 0.44–1.00)
GFR, Estimated: >60 mL/min (ref >60)
Glucose, Bld: 87 mg/dL (ref 70–99)
Potassium: 4 mmol/L (ref 3.5–5.1)
Sodium: 140 mmol/L (ref 135–145)
Total Bilirubin: 0.6 mg/dL (ref 0.3–1.2)
Total Protein: 8.1 g/dL (ref 6.5–8.1)

## 2021-02-03 LAB — RETIC PANEL
Immature Retic Fract: 8.2 % (ref 2.3–15.9)
RBC.: 4.9 MIL/uL (ref 3.87–5.11)
Retic Count, Absolute: 32.3 10*3/uL (ref 19.0–186.0)
Retic Ct Pct: 0.7 % (ref 0.4–3.1)
Reticulocyte Hemoglobin: 30.4 pg (ref >27.9)

## 2021-02-03 MED ORDER — RIVAROXABAN (XARELTO) VTE STARTER PACK (15 & 20 MG): ORAL_TABLET | ORAL | 0 refills | Status: DC

## 2021-02-03 NOTE — Progress Notes (Signed)
Received staff msg from Aura Fey, RN stating that pt will need information regarding financial assist with Xarelto.  After researching I found that [employer] may have assistance but according to their website the pt can't have insurance so I called the pt and advised her to call them to see if she would qualify for their assistance because she has ins.  She verbalized understanding and will call them.

## 2021-02-04 LAB — LUPUS ANTICOAGULANT PANEL
DRVVT: 35.6 s (ref 0.0–47.0)
PTT Lupus Anticoagulant: 28.7 s (ref 0.0–51.9)

## 2021-02-04 LAB — PROTEIN S PANEL
Protein S Activity: 50 % — ABNORMAL LOW (ref 63–140)
Protein S Ag, Free: 74 % (ref 61–136)
Protein S Ag, Total: 84 % (ref 60–150)

## 2021-02-04 LAB — PROTEIN C, TOTAL: Protein C, Total: 87 % (ref 60–150)

## 2021-02-05 LAB — BETA-2-GLYCOPROTEIN I ABS, IGG/M/A
Beta-2 Glyco I IgG: <9 GPI IgG units (ref 0–20)
Beta-2-Glycoprotein I IgA: <9 GPI IgA units (ref 0–25)
Beta-2-Glycoprotein I IgM: 10 GPI IgM units (ref 0–32)

## 2021-02-06 LAB — CARDIOLIPIN ANTIBODIES, IGG, IGM, IGA
Anticardiolipin IgA: <9 APL U/mL (ref 0–11)
Anticardiolipin IgG: <9 GPL U/mL (ref 0–14)
Anticardiolipin IgM: 10 MPL U/mL (ref 0–12)

## 2021-02-07 ENCOUNTER — Telehealth: Payer: Self-pay | Admitting: Physician Assistant

## 2021-02-07 NOTE — Telephone Encounter (Signed)
I spoke to [name] to review the lab results after our consultation on 02/03/2021.  Hypercoagulable work-up was unremarkable except for mild decrease in protein S activity.  With patient's recurrent history of thrombotic events, the recommendation is indefinite anticoagulation.  Patient reports that the co-pay for Xarelto was over $800.  I called Nina Morrow select program [phone]) to inquire if patient is eligible for a savings card.  Based on insurance and age, patient is preliminarily eligible for co-pay assistance.  Advised patient to call the [employer] program to enroll.  In addition, patient's CBC revealed neutropenia at 1.5 K/uL. Etiologies include benign ethnic neutropenia, infectious processes, medications, nutritional anemias and liver disease. Patient is not taking any medications that cause neutropenia. This could be secondary to severe iron deficiency. I suggested that we repeat labs at upcoming visit on 03/03/21 after IV iron infusion. If there is persistent neutropenia, we can proceed with further workup to rule out above etiologies.   Patient expressed understanding and satisfaction with the plan provided.

## 2021-02-07 NOTE — Telephone Encounter (Signed)
Scheduled appt. Called and spoke with patient. Confirmed appt

## 2021-02-08 ENCOUNTER — Telehealth: Payer: Self-pay

## 2021-02-08 ENCOUNTER — Other Ambulatory Visit: Payer: Self-pay | Admitting: Physician Assistant

## 2021-02-08 NOTE — Telephone Encounter (Signed)
Returned pts call/vm as she does not wish to come here for her iron tx from [facility] but req to go to [facility].  A sch/staff message was sent to [facility] notifying her to call the pt and r/s the appts, pt aware to expect a call.  Nina Morrow

## 2021-02-10 ENCOUNTER — Ambulatory Visit: Payer: 59

## 2021-02-10 ENCOUNTER — Telehealth: Payer: Self-pay | Admitting: Hematology and Oncology

## 2021-02-13 ENCOUNTER — Telehealth: Payer: Self-pay | Admitting: *Deleted

## 2021-02-13 NOTE — Telephone Encounter (Signed)
TCT patient. Spoke with her. Advised that per IreneThayil, PA, patient needs to stay on the Xarelto due to multiple DVTS/PEs. Murray Hodgkins advised that pt contact her OB/GYn doctor to assist with managing heavy menstrual cycles. Pt states she has an appt on Wednesday, 02/15/21. Advised that she get labs done if she feels light headed and/or SOB. Pt voiced understanding

## 2021-02-13 NOTE — Telephone Encounter (Signed)
Received call from patient stating she is experiencing increased menstrual bleeding while on Xarelto.  Her cycle started on 02/04/21 and she started her Xarelto on 02/14/21. She states that the bleeding is getting steadily worse with large clots.  She called her OB/GYN doctor and was told to call here.  Please advise

## 2021-02-17 ENCOUNTER — Other Ambulatory Visit: Payer: Self-pay

## 2021-02-17 ENCOUNTER — Ambulatory Visit: Payer: 59

## 2021-02-17 ENCOUNTER — Inpatient Hospital Stay: Payer: 59

## 2021-02-17 VITALS — BP 113/47 | HR 70 | Temp 98.5°F

## 2021-02-17 DIAGNOSIS — Z86718 Personal history of other venous thrombosis and embolism: Secondary | ICD-10-CM | POA: Diagnosis not present

## 2021-02-17 DIAGNOSIS — D500 Iron deficiency anemia secondary to blood loss (chronic): Secondary | ICD-10-CM

## 2021-02-17 MED ORDER — SODIUM CHLORIDE 0.9 % IV SOLN
Freq: Once | INTRAVENOUS | Status: AC
Start: 2021-02-17 — End: 2021-02-17
  Filled 2021-02-17: qty 250

## 2021-02-17 MED ORDER — SODIUM CHLORIDE 0.9 % IV SOLN
200.0000 mg | Freq: Once | INTRAVENOUS | Status: AC
  Administered 2021-02-17: 200 mg via INTRAVENOUS
  Filled 2021-02-17: qty 200

## 2021-02-17 NOTE — Progress Notes (Signed)
Patient complained that arm felt a little "weird" prior to my taking her IV out.  The IV had great blood return and there was no swelling noted.  She had been sitting with it propped on the pillow without moving it during her infusion.  Told to call if any problems.

## 2021-02-17 NOTE — Patient Instructions (Signed)

## 2021-02-22 ENCOUNTER — Encounter: Payer: Self-pay | Admitting: Physician Assistant

## 2021-02-24 ENCOUNTER — Other Ambulatory Visit: Payer: Self-pay

## 2021-02-24 ENCOUNTER — Inpatient Hospital Stay: Payer: 59

## 2021-02-24 VITALS — BP 110/54 | HR 80 | Temp 98.1°F | Resp 18

## 2021-02-24 DIAGNOSIS — Z86718 Personal history of other venous thrombosis and embolism: Secondary | ICD-10-CM | POA: Diagnosis not present

## 2021-02-24 DIAGNOSIS — D500 Iron deficiency anemia secondary to blood loss (chronic): Secondary | ICD-10-CM

## 2021-02-24 MED ORDER — SODIUM CHLORIDE 0.9 % IV SOLN
200.0000 mg | Freq: Once | INTRAVENOUS | Status: AC
  Administered 2021-02-24: 200 mg via INTRAVENOUS
  Filled 2021-02-24: qty 200

## 2021-02-24 MED ORDER — SODIUM CHLORIDE 0.9 % IV SOLN
Freq: Once | INTRAVENOUS | Status: AC
  Filled 2021-02-24: qty 250

## 2021-02-24 NOTE — Patient Instructions (Signed)

## 2021-02-27 ENCOUNTER — Telehealth: Payer: Self-pay

## 2021-02-27 ENCOUNTER — Telehealth: Payer: Self-pay | Admitting: Physician Assistant

## 2021-02-27 NOTE — Telephone Encounter (Signed)
Pt called in to cancel her 5/6 appt as she will have [facility] r./s her appts to [employer]

## 2021-02-27 NOTE — Telephone Encounter (Signed)
Cancelled appts per 5/2 sch msg. Pt said she will schedule her infusions at [facility].

## 2021-03-02 ENCOUNTER — Encounter: Payer: Self-pay | Admitting: Physician Assistant

## 2021-03-03 ENCOUNTER — Other Ambulatory Visit: Payer: 59

## 2021-03-03 ENCOUNTER — Ambulatory Visit: Payer: 59 | Admitting: Hematology and Oncology

## 2021-03-03 ENCOUNTER — Other Ambulatory Visit: Payer: Self-pay | Admitting: Physician Assistant

## 2021-03-03 ENCOUNTER — Ambulatory Visit: Payer: 59 | Admitting: Physician Assistant

## 2021-03-03 ENCOUNTER — Ambulatory Visit: Payer: 59

## 2021-03-03 MED ORDER — RIVAROXABAN 20 MG PO TABS: 20.0000 mg | ORAL_TABLET | Freq: Every day | ORAL | 3 refills | Status: DC

## 2021-03-07 ENCOUNTER — Encounter: Payer: Self-pay | Admitting: Physician Assistant

## 2021-03-08 NOTE — H&P (Signed)
Nina Morrow is an 30 y.o. 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   Patient was evaluated on 01/25/2021 for colposcopy with cervical biopsies for ASCUS/HRHPV positive pap smear. Reports history of colposcopy around age 68. Of note, also has history of AUB. Had IUD in place but it has since expulsed.  Medical history significant for history of DVT in bilateral LE extremities on indefinite anticoagulation (Xarelto 15mg  BID). Has history of decreased Protein S activity. Patient's Heme/Onc provider [name], PA-C) recommends holding Xarelto 48 hours prior to procedure and resume once hemostasis achieved.  Pap smear history: ASCUS (+) HRHPV 2022 Cervical biopsies at 12 and 5 o'clock reveal CIN II-III   Patient Active Problem List   Diagnosis Date Noted  . Iron deficiency anemia 02/03/2021  . History of DVT (deep vein thrombosis) 02/03/2021  . Personal history of venous thrombosis and embolism 02/03/2021    MEDICAL/FAMILY/SOCIAL HX: No LMP recorded.    Past Medical History:  Diagnosis Date  . Bipolar 1 disorder ([place])    with depression and anxiety. stopped taking lithium december 2021.   Marland Kitchen DVT (deep venous thrombosis) ([place])   . Hallucination   . PCOS (polycystic ovarian syndrome)   . Pulmonary emboli [facility])     Past Surgical History:  Procedure Laterality Date  . BARIATRIC SURGERY    . TONSILLECTOMY      Family History  Problem Relation Age of Onset  . Transient ischemic attack Mother        69  . Pulmonary embolism Sister        May 2021  . Breast cancer Maternal Grandmother   . Stomach cancer Maternal Grandfather   . Lung cancer Maternal Grandfather   . Breast cancer Cousin     Social History:  reports that she quit smoking about 9 years ago. Her smoking use included e-cigarettes, cigarettes, and cigars. She quit after 2.00 years of use. She has never used smokeless tobacco. She reports current  alcohol use. She reports current drug use. Drug: Marijuana.  ALLERGIES/MEDS:  Allergies:  Allergies  Allergen Reactions  . Aripiprazole Other (See Comments)    Light headed, SOB, diarrhea, vomiting  . Grass Extracts [Gramineae Pollens] Other (See Comments)    Coughing, sneezing  . Latex Hives and Rash    No medications prior to admission.     Review of Systems  Constitutional: Negative.   HENT: Negative.   Eyes: Negative.   Respiratory: Negative.   Cardiovascular: Negative.   Gastrointestinal: Negative.   Genitourinary: Negative.   Musculoskeletal: Negative.   Skin: Negative.   Neurological: Negative.   Endo/Heme/Allergies: Negative.   Psychiatric/Behavioral: Negative.     There were no vitals taken for this visit. Gen: NAD Cardio:  RRR Lungs:  CTAB, no wheezes/rales/rhonchi Abd:  Soft, non-distended, non-tender Ext:  No bilateral LE edema Pelvic: Labia - unremarkable, vagina - pink moist mucosa, no lesions or abdominal discharge, IUD strings visualized, cervix - no discharge or lesions or CMT, adnexa - no masses or tenderness  No results found for this or any previous visit (from the past 24 hour(s)).  No results found.   ASSESSMENT/PLAN: Nina Morrow is a 30 y.o. 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   - Admit to [place] labs (CBC, T&S, COVID screen) - Diet:  NPO - IVF:  Per anesthesia - VTE Prophylaxis:  SCDs -  Per Heme/Onc recommendations - hold Xarelto 48 hours prior to surgery and resume once hemostasis achieved (likely POD#1) - D/C home same day  Consents: The nature of the CKC is to surgically destroy or remove the abnormal areas, and to send a piece of the tissue to the lab for analysis.  The procedure is diagnostic, and usually therapeutic.  This procedure holds risks of infection, bleeding requiring a blood transfusion, incomplete resolution of the abnormality requiring a  future procedure or additional follow-up testing, inability to carry a pregnancy (cervical insufficiency), cervical stenosis (narrowing), blood clot (DVT or PE), and ultimately a risk of death.  Patient understands these risks and agrees to proceed with the above named procedure. Patient was consented for blood products.  The patient is aware that bleeding may result in the need for a blood transfusion which includes risk of transmission of HIV (1:2 million), Hepatitis C (1:2 million), and Hepatitis B (1:200 thousand) and transfusion reaction.  Patient voiced understanding of the above risks as well as understanding of indications for blood transfusion.  Drema Dallas, DO [phone] (office)

## 2021-03-10 ENCOUNTER — Ambulatory Visit: Payer: 59

## 2021-03-15 DIAGNOSIS — R55 Syncope and collapse: Secondary | ICD-10-CM | POA: Insufficient documentation

## 2021-03-15 DIAGNOSIS — R0602 Shortness of breath: Secondary | ICD-10-CM | POA: Insufficient documentation

## 2021-03-15 DIAGNOSIS — R0789 Other chest pain: Secondary | ICD-10-CM | POA: Insufficient documentation

## 2021-03-15 DIAGNOSIS — R002 Palpitations: Secondary | ICD-10-CM | POA: Insufficient documentation

## 2021-03-17 ENCOUNTER — Encounter: Payer: Self-pay | Admitting: Physician Assistant

## 2021-03-17 ENCOUNTER — Other Ambulatory Visit: Payer: Self-pay

## 2021-03-17 ENCOUNTER — Ambulatory Visit: Payer: 59

## 2021-03-17 ENCOUNTER — Encounter (HOSPITAL_BASED_OUTPATIENT_CLINIC_OR_DEPARTMENT_OTHER): Payer: Self-pay | Admitting: Obstetrics and Gynecology

## 2021-03-20 NOTE — Progress Notes (Signed)
Pt was seen by Dr. Geanie Berlin 03/15/21 for c/o cp, sob syncope and palpitations. H/O PE / DVT. Additionally pt has OSA does not use CPAP.  Spoke with Claiborne Billings at Dr. Delora Fuel office and requested that pt complete testing recommended by Cardiology prior to outpatient surgery (echocardiogram,7d Holter monitor).

## 2021-03-22 ENCOUNTER — Encounter: Payer: Self-pay | Admitting: Physician Assistant

## 2021-03-24 ENCOUNTER — Ambulatory Visit (HOSPITAL_BASED_OUTPATIENT_CLINIC_OR_DEPARTMENT_OTHER): Admission: RE | Admit: 2021-03-24 | Payer: 59 | Source: Home / Self Care | Admitting: Obstetrics and Gynecology

## 2021-03-24 ENCOUNTER — Encounter (HOSPITAL_BASED_OUTPATIENT_CLINIC_OR_DEPARTMENT_OTHER): Admission: RE | Payer: Self-pay | Source: Home / Self Care

## 2021-03-24 HISTORY — DX: Palpitations: R00.2

## 2021-03-24 HISTORY — DX: Syncope and collapse: R55

## 2021-03-24 SURGERY — CONE BIOPSY, CERVIX
Anesthesia: Choice

## 2021-03-31 ENCOUNTER — Inpatient Hospital Stay: Payer: 59 | Attending: Physician Assistant

## 2021-03-31 ENCOUNTER — Other Ambulatory Visit: Payer: Self-pay

## 2021-03-31 VITALS — BP 117/49 | HR 76 | Temp 98.8°F | Resp 18

## 2021-03-31 DIAGNOSIS — Z86711 Personal history of pulmonary embolism: Secondary | ICD-10-CM | POA: Insufficient documentation

## 2021-03-31 DIAGNOSIS — Z86718 Personal history of other venous thrombosis and embolism: Secondary | ICD-10-CM | POA: Diagnosis not present

## 2021-03-31 DIAGNOSIS — D509 Iron deficiency anemia, unspecified: Secondary | ICD-10-CM | POA: Diagnosis not present

## 2021-03-31 DIAGNOSIS — D500 Iron deficiency anemia secondary to blood loss (chronic): Secondary | ICD-10-CM

## 2021-03-31 MED ORDER — IRON SUCROSE 20 MG/ML IV SOLN
200.0000 mg | Freq: Once | INTRAVENOUS | Status: AC
  Administered 2021-03-31: 200 mg via INTRAVENOUS
  Filled 2021-03-31: qty 200

## 2021-03-31 MED ORDER — SODIUM CHLORIDE 0.9 % IV SOLN
Freq: Once | INTRAVENOUS | Status: AC
  Filled 2021-03-31: qty 250

## 2021-03-31 NOTE — Patient Instructions (Signed)
[facility]  Discharge Instructions: Thank you for choosing [facility] to provide your oncology and hematology care.   If you have a lab appointment with the Cancer Center, please go directly to the Cancer Center and check in at the registration area.   Wear comfortable clothing and clothing appropriate for easy access to any Portacath or PICC line.   We strive to give you quality time with your provider. You may need to reschedule your appointment if you arrive late (15 or more minutes).  Arriving late affects you and other patients whose appointments are after yours.  Also, if you miss three or more appointments without notifying the office, you may be dismissed from the clinic at the provider's discretion.      For prescription refill requests, have your pharmacy contact our office and allow 72 hours for refills to be completed.       To help prevent nausea and vomiting after your treatment, we encourage you to take your nausea medication as directed.  BELOW ARE SYMPTOMS THAT SHOULD BE REPORTED IMMEDIATELY: *FEVER GREATER THAN 100.4 F (38 C) OR HIGHER *CHILLS OR SWEATING *NAUSEA AND VOMITING THAT IS NOT CONTROLLED WITH YOUR NAUSEA MEDICATION *UNUSUAL SHORTNESS OF BREATH *UNUSUAL BRUISING OR BLEEDING *URINARY PROBLEMS (pain or burning when urinating, or frequent urination) *BOWEL PROBLEMS (unusual diarrhea, constipation, pain near the anus) TENDERNESS IN MOUTH AND THROAT WITH OR WITHOUT PRESENCE OF ULCERS (sore throat, sores in mouth, or a toothache) UNUSUAL RASH, SWELLING OR PAIN  UNUSUAL VAGINAL DISCHARGE OR ITCHING   Items with * indicate a potential emergency and should be followed up as soon as possible or go to the Emergency Department if any problems should occur.  Please show the CHEMOTHERAPY ALERT CARD or IMMUNOTHERAPY ALERT CARD at check-in to the Emergency Department and triage nurse.  Should you have questions after your  visit or need to cancel or reschedule your appointment, please contact [facility]  Dept: [phone]  and follow the prompts.  Office hours are 8:00 a.m. to 4:30 p.m. Monday - Friday. Please note that voicemails left after 4:00 p.m. may not be returned until the following business day.  We are closed weekends and major holidays. You have access to a nurse at all times for urgent questions. Please call the main number to the clinic Dept: [phone] and follow the prompts.   For any non-urgent questions, you may also contact your provider using MyChart. We now offer e-Visits for anyone 18 and older to request care online for non-urgent symptoms. For details visit [email or web address].   Also download the MyChart app! Go to the app store, search "MyChart", open the app, select [place], and log in with your MyChart username and password.  Due to Covid, a mask is required upon entering the hospital/clinic. If you do not have a mask, one will be given to you upon arrival. For doctor visits, patients may have 1 support person aged 18 or older with them. For treatment visits, patients cannot have anyone with them due to current Covid guidelines and our immunocompromised population.   

## 2021-04-07 ENCOUNTER — Other Ambulatory Visit: Payer: Self-pay

## 2021-04-07 ENCOUNTER — Inpatient Hospital Stay: Payer: 59

## 2021-04-07 VITALS — BP 128/56 | HR 66 | Temp 98.1°F | Resp 18

## 2021-04-07 DIAGNOSIS — Z86718 Personal history of other venous thrombosis and embolism: Secondary | ICD-10-CM | POA: Diagnosis not present

## 2021-04-07 DIAGNOSIS — D500 Iron deficiency anemia secondary to blood loss (chronic): Secondary | ICD-10-CM

## 2021-04-07 MED ORDER — SODIUM CHLORIDE 0.9 % IV SOLN
200.0000 mg | Freq: Once | INTRAVENOUS | Status: AC
  Administered 2021-04-07: 200 mg via INTRAVENOUS
  Filled 2021-04-07: qty 200

## 2021-04-07 MED ORDER — SODIUM CHLORIDE 0.9 % IV SOLN
Freq: Once | INTRAVENOUS | Status: AC
  Filled 2021-04-07: qty 250

## 2021-04-07 NOTE — Patient Instructions (Signed)

## 2021-04-14 ENCOUNTER — Other Ambulatory Visit: Payer: Self-pay

## 2021-04-14 ENCOUNTER — Ambulatory Visit: Payer: 59 | Admitting: Physician Assistant

## 2021-04-14 ENCOUNTER — Other Ambulatory Visit: Payer: 59

## 2021-04-14 ENCOUNTER — Inpatient Hospital Stay: Payer: 59

## 2021-04-14 VITALS — BP 115/43 | HR 64 | Temp 98.2°F

## 2021-04-14 DIAGNOSIS — D500 Iron deficiency anemia secondary to blood loss (chronic): Secondary | ICD-10-CM

## 2021-04-14 DIAGNOSIS — Z86718 Personal history of other venous thrombosis and embolism: Secondary | ICD-10-CM | POA: Diagnosis not present

## 2021-04-14 MED ORDER — SODIUM CHLORIDE 0.9 % IV SOLN
Freq: Once | INTRAVENOUS | Status: AC
  Filled 2021-04-14: qty 250

## 2021-04-14 MED ORDER — IRON SUCROSE 20 MG/ML IV SOLN
200.0000 mg | Freq: Once | INTRAVENOUS | Status: AC
  Administered 2021-04-14: 200 mg via INTRAVENOUS
  Filled 2021-04-14: qty 200

## 2021-04-14 NOTE — Patient Instructions (Signed)

## 2021-04-18 ENCOUNTER — Ambulatory Visit: Payer: BC Managed Care – PPO | Admitting: Neurology

## 2021-05-11 ENCOUNTER — Other Ambulatory Visit: Payer: Self-pay | Admitting: Physician Assistant

## 2021-05-11 ENCOUNTER — Other Ambulatory Visit (HOSPITAL_COMMUNITY): Payer: Self-pay | Admitting: Physician Assistant

## 2021-05-11 DIAGNOSIS — D500 Iron deficiency anemia secondary to blood loss (chronic): Secondary | ICD-10-CM

## 2021-05-12 ENCOUNTER — Inpatient Hospital Stay: Payer: 59 | Attending: Physician Assistant

## 2021-05-12 ENCOUNTER — Other Ambulatory Visit: Payer: Self-pay

## 2021-05-12 ENCOUNTER — Inpatient Hospital Stay (HOSPITAL_BASED_OUTPATIENT_CLINIC_OR_DEPARTMENT_OTHER): Payer: 59 | Admitting: Physician Assistant

## 2021-05-12 ENCOUNTER — Encounter: Payer: Self-pay | Admitting: *Deleted

## 2021-05-12 VITALS — BP 119/68 | HR 82 | Temp 98.4°F | Resp 20 | Ht 71.0 in | Wt 271.8 lb

## 2021-05-12 DIAGNOSIS — K5900 Constipation, unspecified: Secondary | ICD-10-CM | POA: Diagnosis not present

## 2021-05-12 DIAGNOSIS — Z800 Family history of malignant neoplasm of digestive organs: Secondary | ICD-10-CM | POA: Insufficient documentation

## 2021-05-12 DIAGNOSIS — Z86711 Personal history of pulmonary embolism: Secondary | ICD-10-CM | POA: Diagnosis present

## 2021-05-12 DIAGNOSIS — Z79899 Other long term (current) drug therapy: Secondary | ICD-10-CM | POA: Insufficient documentation

## 2021-05-12 DIAGNOSIS — D649 Anemia, unspecified: Secondary | ICD-10-CM

## 2021-05-12 DIAGNOSIS — Z87891 Personal history of nicotine dependence: Secondary | ICD-10-CM | POA: Insufficient documentation

## 2021-05-12 DIAGNOSIS — Z8249 Family history of ischemic heart disease and other diseases of the circulatory system: Secondary | ICD-10-CM | POA: Insufficient documentation

## 2021-05-12 DIAGNOSIS — Z7901 Long term (current) use of anticoagulants: Secondary | ICD-10-CM | POA: Diagnosis not present

## 2021-05-12 DIAGNOSIS — F319 Bipolar disorder, unspecified: Secondary | ICD-10-CM | POA: Diagnosis not present

## 2021-05-12 DIAGNOSIS — Z801 Family history of malignant neoplasm of trachea, bronchus and lung: Secondary | ICD-10-CM | POA: Insufficient documentation

## 2021-05-12 DIAGNOSIS — R0602 Shortness of breath: Secondary | ICD-10-CM | POA: Insufficient documentation

## 2021-05-12 DIAGNOSIS — E538 Deficiency of other specified B group vitamins: Secondary | ICD-10-CM | POA: Insufficient documentation

## 2021-05-12 DIAGNOSIS — D508 Other iron deficiency anemias: Secondary | ICD-10-CM

## 2021-05-12 DIAGNOSIS — R55 Syncope and collapse: Secondary | ICD-10-CM | POA: Diagnosis not present

## 2021-05-12 DIAGNOSIS — R0600 Dyspnea, unspecified: Secondary | ICD-10-CM | POA: Insufficient documentation

## 2021-05-12 DIAGNOSIS — Z86718 Personal history of other venous thrombosis and embolism: Secondary | ICD-10-CM | POA: Diagnosis not present

## 2021-05-12 DIAGNOSIS — D509 Iron deficiency anemia, unspecified: Secondary | ICD-10-CM | POA: Diagnosis present

## 2021-05-12 DIAGNOSIS — D500 Iron deficiency anemia secondary to blood loss (chronic): Secondary | ICD-10-CM

## 2021-05-12 DIAGNOSIS — Z803 Family history of malignant neoplasm of breast: Secondary | ICD-10-CM | POA: Diagnosis not present

## 2021-05-12 LAB — CBC WITH DIFFERENTIAL (CANCER CENTER ONLY)
Abs Immature Granulocytes: 0 10*3/uL (ref 0.00–0.07)
Basophils Absolute: 0 10*3/uL (ref 0.0–0.1)
Basophils Relative: 1 %
Eosinophils Absolute: 0.2 10*3/uL (ref 0.0–0.5)
Eosinophils Relative: 4 %
HCT: 37.1 % (ref 36.0–46.0)
Hemoglobin: 11.4 g/dL — ABNORMAL LOW (ref 12.0–15.0)
Immature Granulocytes: 0 %
Lymphocytes Relative: 47 %
Lymphs Abs: 1.9 10*3/uL (ref 0.7–4.0)
MCH: 25.1 pg — ABNORMAL LOW (ref 26.0–34.0)
MCHC: 30.7 g/dL (ref 30.0–36.0)
MCV: 81.5 fL (ref 80.0–100.0)
Monocytes Absolute: 0.4 10*3/uL (ref 0.1–1.0)
Monocytes Relative: 10 %
Neutro Abs: 1.5 10*3/uL — ABNORMAL LOW (ref 1.7–7.7)
Neutrophils Relative %: 38 %
Platelet Count: 292 10*3/uL (ref 150–400)
RBC: 4.55 MIL/uL (ref 3.87–5.11)
RDW: 17.5 % — ABNORMAL HIGH (ref 11.5–15.5)
WBC Count: 4.1 10*3/uL (ref 4.0–10.5)
nRBC: 0 % (ref 0.0–0.2)

## 2021-05-12 LAB — FERRITIN: Ferritin: 10 ng/mL — ABNORMAL LOW (ref 11–307)

## 2021-05-12 LAB — IRON AND TIBC
Iron: 27 ug/dL — ABNORMAL LOW (ref 41–142)
Saturation Ratios: 7 % — ABNORMAL LOW (ref 21–57)
TIBC: 395 ug/dL (ref 236–444)
UIBC: 368 ug/dL (ref 120–384)

## 2021-05-12 LAB — VITAMIN B12: Vitamin B-12: 177 pg/mL — ABNORMAL LOW (ref 180–914)

## 2021-05-12 NOTE — Progress Notes (Signed)
[facility] [phone]   [phone] [place] NOTE  Patient Care Team: Trey Sailors, PA as PCP - General (Physician Assistant)  Hematological/Oncological History #Recurrent DVTs/PE: 1) 06/2010: Right leg DVT and PE treated with warfarin x 3 months-felt to be provoked by OCPs 2) 2015: Left Leg DVT. Felt to be provoked by OCPs. Recommended indefinite AC and treated with warfarin. Patient self-discontinued after 84months 3) 12/2017: Right leg DVT and PE treated with warfarin-no provoking factor since not on OCPS.  4) 03/14/2020: Checked Factor V Leiden mutation, Prothrombin Mutation, Lupus anticoagulant-Not present 5) 03/30/2020: Switch to Eliquis.  6) 10/2020: Discontinued Eliquis due to cost.  7) 02/02/2021: Establish care with [name] PA-C. Recommend indefinite anticoagulation with Xarelto.   #Iron deficiency anemia: 1) 01/20/2021: Iron panel from [facility] revealed IDA with total iron 36, iron saturation 8%, ferritin 4.  2) 02/17/2021-04/14/2021: Received IV venofer x 5 doses.   CHIEF COMPLAINT: -History of multiple DVT and PE -Iron deficiency anemia   HISTORY OF PRESENTING ILLNESS:  Nina Morrow 30 y.o. female who returns for a follow up for history of DVTs/PE while on Xarelto and iron deficiency anemia s/p IV venofer x 5 doses.   Patient notes that her energy levels have improved since receiving IV iron. She is currently under evaluation by cardiology for having multiple syncopal episodes. She is uncertain the underlying cause but lately, she has been having episodes in the shower. She wore a Holter monitor for 30 days last month to evaluate for arrhythmias but is not aware of the results. Her appetite is good without any noticeable weight changes. She deneis any nausea, vomiting or abdominal pain. Her bowel movements are regular without any diarrhea or constipation. She recently switched to Norethindrone birth control pills  after her IUD spontaneously fell out. She notes that her menstrual bleeding has improved some but she notices more clots. She has mild constipation that improves with daily intake of milk of magnesia. She denies any hematochezia or melena. Patient has persistent shortness of breath, mainly with exertion. rest. She denies any fevers, chills, chest pain or cough. She has no other complaints. Rest of the 10 point ROS is below.   MEDICAL HISTORY:  Past Medical History:  Diagnosis Date   Bipolar 1 disorder ([place])    with depression and anxiety. stopped taking lithium december 2021.    DVT (deep venous thrombosis) (HCC)    Hallucination    Palpitation    PCOS (polycystic ovarian syndrome)    Pulmonary emboli ([facility])    Syncope and collapse     SURGICAL HISTORY: Past Surgical History:  Procedure Laterality Date   BARIATRIC SURGERY     TONSILLECTOMY      SOCIAL HISTORY: Social History   Socioeconomic History   Marital status: Significant Other    Spouse name: Not on file   Number of children: Not on file   Years of education: Not on file   Highest education level: Not on file  Occupational History   Not on file  Tobacco Use   Smoking status: Former    Years: 2.00    Types: E-cigarettes, Cigarettes, Cigars    Quit date: 02/04/2012    Years since quitting: 9.2   Smokeless tobacco: Never   Tobacco comments:    Used to smoke 1 pack a month  Substance and Sexual Activity   Alcohol use: Yes    Comment: socially. 1-2 drinks/month   Drug use: Yes  Types: Marijuana    Comment: vapes THC   Sexual activity: Not on file  Other Topics Concern   Not on file  Social History Narrative   Not on file   Social Determinants of Health   Financial Resource Strain: Not on file  Food Insecurity: Not on file  Transportation Needs: Not on file  Physical Activity: Not on file  Stress: Not on file  Social Connections: Not on file  Intimate Partner Violence: Not on file    FAMILY  HISTORY: Family History  Problem Relation Age of Onset   Transient ischemic attack Mother        2015   Pulmonary embolism Sister        May 2021   Breast cancer Maternal Grandmother    Stomach cancer Maternal Grandfather    Lung cancer Maternal Grandfather    Breast cancer Cousin     ALLERGIES:  is allergic to aripiprazole, grass extracts [gramineae pollens], and latex.  MEDICATIONS:  Current Outpatient Medications  Medication Sig Dispense Refill   albuterol (VENTOLIN HFA) 108 (90 Base) MCG/ACT inhaler Inhale into the lungs. prn     PREMARIN vaginal cream Place vaginally.     rivaroxaban (XARELTO) 20 MG TABS tablet Take 1 tablet (20 mg total) by mouth daily with supper. 30 tablet 3   No current facility-administered medications for this visit.    REVIEW OF SYSTEMS:   Constitutional: ( - ) fevers, ( - )  chills , (  ) night sweats Eyes: ( - ) blurriness of vision, ( - ) double vision, ( - ) watery eyes Ears, nose, mouth, throat, and face: ( - ) mucositis, ( - ) sore throat Respiratory: ( - ) cough, ( + ) dyspnea, ( - ) wheezes Cardiovascular: ( - ) palpitation, ( - ) chest discomfort, ( - ) lower extremity swelling Gastrointestinal:  (- ) nausea, ( - ) heartburn, ( - ) change in bowel habits Skin: ( - ) abnormal skin rashes Lymphatics: ( - ) new lymphadenopathy, ( - ) easy bruising Neurological: ( - ) numbness, ( - ) tingling, ( - ) new weaknesses Behavioral/Psych: ( - ) mood change, ( - ) new changes  All other systems were reviewed with the patient and are negative.  PHYSICAL EXAMINATION: ECOG PERFORMANCE STATUS: 1 - Symptomatic but completely ambulatory  Vitals:   05/12/21 0828  BP: 119/68  Pulse: 82  Resp: 20  Temp: 98.4 F (36.9 C)  SpO2: 100%   Filed Weights   05/12/21 0828  Weight: 271 lb 12.8 oz (123.3 kg)    GENERAL: well appearing african [place] female in NAD  SKIN: skin color, texture, turgor are normal, no rashes or significant lesions EYES:  conjunctiva are pink and non-injected, sclera clear OROPHARYNX: no exudate, no erythema; lips, buccal mucosa, and tongue normal  NECK: supple, non-tender LYMPH:  no palpable lymphadenopathy in the cervical, axillary or supraclavicular lymph nodes.  LUNGS: clear to auscultation and percussion with normal breathing effort HEART: regular rate & rhythm and no murmurs and no lower extremity edema ABDOMEN: soft, non-tender, non-distended, normal bowel sounds Musculoskeletal: no cyanosis of digits and no clubbing  PSYCH: alert & oriented x 3, fluent speech NEURO: no focal motor/sensory deficits  LABORATORY DATA:  I have reviewed the data as listed CBC Latest Ref Rng & Units 05/12/2021 02/03/2021 11/22/2020  WBC 4.0 - 10.5 K/uL 4.1 3.5(L) 4.8  Hemoglobin 12.0 - 15.0 g/dL 11.4(L) 12.0 12.1  Hematocrit 36.0 - 46.0 %  37.1 39.0 41.6  Platelets 150 - 400 K/uL 292 325 334    CMP Latest Ref Rng & Units 02/03/2021 11/22/2020  Glucose 70 - 99 mg/dL 87 92  BUN 6 - 20 mg/dL 7 5(L)  Creatinine 0.44 - 1.00 mg/dL 0.84 0.99  Sodium 135 - 145 mmol/L 140 139  Potassium 3.5 - 5.1 mmol/L 4.0 4.3  Chloride 98 - 111 mmol/L 104 107  CO2 22 - 32 mmol/L 24 24  Calcium 8.9 - 10.3 mg/dL 8.8(L) 9.3  Total Protein 6.5 - 8.1 g/dL 8.1 7.5  Total Bilirubin 0.3 - 1.2 mg/dL 0.6 0.9  Alkaline Phos 38 - 126 U/L 80 62  AST 15 - 41 U/L 16 14(L)  ALT 0 - 44 U/L 15 14   ASSESSMENT & PLAN Nina Morrow is a 30 y.o. female presenting to the clinic for evaluation for anticoagulation due to history of multiple DVT's and Pe's along with iron deficiency anemia.   #Multiple DVT's and PE's: --Hypercoagulable workup was completed by [facility] in May 2021 which included Factor V Leiden mutation, Prothrombin Mutation, Lupus anticoagulant, all three were unremarkable.  -Remaining hypercoagulable workup was done here on 02/03/2021 checking protein C and protein S activity, beta-2 glycoprotein antibody and cardiolipin  antibody. Findings were unremarkable except for mild Protein S activity.  --Recommend indefinite anticoagulation due to recurrent DVT/PEs.  --Currently on Xarelto with good tolerance.   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  --Patient has history of gastric sleeve surgery so she unlikely can absorb iron through diet or through PO supplementation.  --Patient recently started on progesterone containing birth control pills. She reports menstrual bleeding has improved but she sees more clots.  --Patient is awaiting Cold Knife Conization of the cervix. Procedure is on hold as patient is undergoing workup for cardiac evaluation for syncope, palpitations with echo and holter monitor.  --Received IV venofer x 5 doses from 02/17/2021-04/14/2021 --Labs today show persistent iron deficiency with mild anemia. Iron saturation is 7%, Ferritin is 10, and Hemoglobin is 11.4.  --Recommend to proceed with another 3 doses of IV venofer.   #B12 deficiency: --Labs today show vitamin B12 177.  --Secondary to gastric sleeve surgery.  --Recommend IM B12 injection once weekly x 4 doses and then transition to once a month B12 injection.   Follow up: -IV venofer once weekly x 3 doses -B12 injection once weekly x 4 doses and then transition to once a month. -RTC in 3 months with repeat labs.    Orders Placed This Encounter  Procedures   Vitamin B12    Standing Status:   Future    Number of Occurrences:   1    Standing Expiration Date:   05/12/2022    All questions were answered. The patient knows to call the clinic with any problems, questions or concerns.  I have spent a total of 30 minutes minutes of face-to-face and non-face-to-face time, preparing to see the patient, obtaining and/or reviewing separately obtained history, performing a  medically appropriate examination, counseling and educating the patient, ordering medications/tests, documenting clinical information in the electronic health record, and care coordination.   [name], PA-C Department of Hematology/Oncology [place] at [facility] Phone: [phone]

## 2021-05-23 ENCOUNTER — Inpatient Hospital Stay: Payer: 59

## 2021-05-26 ENCOUNTER — Inpatient Hospital Stay (HOSPITAL_COMMUNITY)
Admission: AD | Admit: 2021-05-26 | Discharge: 2021-05-27 | DRG: 885 | Disposition: A | Discharge status: Home / Self Care | Payer: 59 | Source: Intra-hospital | Attending: Psychiatry | Admitting: Psychiatry

## 2021-05-26 ENCOUNTER — Encounter (HOSPITAL_COMMUNITY): Payer: Self-pay | Admitting: Emergency Medicine

## 2021-05-26 ENCOUNTER — Encounter: Payer: Self-pay | Admitting: Physician Assistant

## 2021-05-26 ENCOUNTER — Ambulatory Visit (HOSPITAL_COMMUNITY)
Admission: EM | Admit: 2021-05-26 | Discharge: 2021-05-26 | Disposition: A | Discharge status: Other Acute Inpatient Hospital | Payer: BC Managed Care – PPO | Attending: Nurse Practitioner | Admitting: Nurse Practitioner

## 2021-05-26 ENCOUNTER — Encounter (HOSPITAL_COMMUNITY): Payer: Self-pay | Admitting: Psychiatry

## 2021-05-26 ENCOUNTER — Other Ambulatory Visit: Payer: Self-pay

## 2021-05-26 DIAGNOSIS — E282 Polycystic ovarian syndrome: Secondary | ICD-10-CM | POA: Diagnosis present

## 2021-05-26 DIAGNOSIS — Z86718 Personal history of other venous thrombosis and embolism: Secondary | ICD-10-CM

## 2021-05-26 DIAGNOSIS — F4310 Post-traumatic stress disorder, unspecified: Secondary | ICD-10-CM | POA: Diagnosis present

## 2021-05-26 DIAGNOSIS — Z20822 Contact with and (suspected) exposure to covid-19: Secondary | ICD-10-CM | POA: Diagnosis not present

## 2021-05-26 DIAGNOSIS — Z7901 Long term (current) use of anticoagulants: Secondary | ICD-10-CM | POA: Diagnosis not present

## 2021-05-26 DIAGNOSIS — Z9151 Personal history of suicidal behavior: Secondary | ICD-10-CM | POA: Insufficient documentation

## 2021-05-26 DIAGNOSIS — R45851 Suicidal ideations: Secondary | ICD-10-CM | POA: Insufficient documentation

## 2021-05-26 DIAGNOSIS — Z91040 Latex allergy status: Secondary | ICD-10-CM

## 2021-05-26 DIAGNOSIS — Z800 Family history of malignant neoplasm of digestive organs: Secondary | ICD-10-CM

## 2021-05-26 DIAGNOSIS — R519 Headache, unspecified: Secondary | ICD-10-CM | POA: Insufficient documentation

## 2021-05-26 DIAGNOSIS — F1290 Cannabis use, unspecified, uncomplicated: Secondary | ICD-10-CM | POA: Insufficient documentation

## 2021-05-26 DIAGNOSIS — Z87891 Personal history of nicotine dependence: Secondary | ICD-10-CM | POA: Insufficient documentation

## 2021-05-26 DIAGNOSIS — Z803 Family history of malignant neoplasm of breast: Secondary | ICD-10-CM | POA: Diagnosis not present

## 2021-05-26 DIAGNOSIS — F3181 Bipolar II disorder: Principal | ICD-10-CM | POA: Diagnosis present

## 2021-05-26 DIAGNOSIS — Z86711 Personal history of pulmonary embolism: Secondary | ICD-10-CM | POA: Diagnosis not present

## 2021-05-26 DIAGNOSIS — Z9884 Bariatric surgery status: Secondary | ICD-10-CM | POA: Diagnosis not present

## 2021-05-26 DIAGNOSIS — Z801 Family history of malignant neoplasm of trachea, bronchus and lung: Secondary | ICD-10-CM

## 2021-05-26 DIAGNOSIS — F3281 Premenstrual dysphoric disorder: Secondary | ICD-10-CM | POA: Diagnosis present

## 2021-05-26 DIAGNOSIS — Z888 Allergy status to other drugs, medicaments and biological substances status: Secondary | ICD-10-CM

## 2021-05-26 DIAGNOSIS — Z823 Family history of stroke: Secondary | ICD-10-CM

## 2021-05-26 DIAGNOSIS — F3130 Bipolar disorder, current episode depressed, mild or moderate severity, unspecified: Secondary | ICD-10-CM | POA: Insufficient documentation

## 2021-05-26 LAB — CBC WITH DIFFERENTIAL/PLATELET
Abs Immature Granulocytes: 0 10*3/uL (ref 0.00–0.07)
Basophils Absolute: 0 10*3/uL (ref 0.0–0.1)
Basophils Relative: 1 %
Eosinophils Absolute: 0.1 10*3/uL (ref 0.0–0.5)
Eosinophils Relative: 2 %
HCT: 39.9 % (ref 36.0–46.0)
Hemoglobin: 12.4 g/dL (ref 12.0–15.0)
Immature Granulocytes: 0 %
Lymphocytes Relative: 47 %
Lymphs Abs: 1.4 10*3/uL (ref 0.7–4.0)
MCH: 25.1 pg — ABNORMAL LOW (ref 26.0–34.0)
MCHC: 31.1 g/dL (ref 30.0–36.0)
MCV: 80.6 fL (ref 80.0–100.0)
Monocytes Absolute: 0.2 10*3/uL (ref 0.1–1.0)
Monocytes Relative: 7 %
Neutro Abs: 1.3 10*3/uL — ABNORMAL LOW (ref 1.7–7.7)
Neutrophils Relative %: 43 %
Platelets: 307 10*3/uL (ref 150–400)
RBC: 4.95 MIL/uL (ref 3.87–5.11)
RDW: 17.2 % — ABNORMAL HIGH (ref 11.5–15.5)
WBC: 3 10*3/uL — ABNORMAL LOW (ref 4.0–10.5)
nRBC: 0 % (ref 0.0–0.2)

## 2021-05-26 LAB — POCT URINE DRUG SCREEN - MANUAL ENTRY (I-SCREEN)
POC Amphetamine UR: NOT DETECTED
POC Buprenorphine (BUP): NOT DETECTED
POC Cocaine UR: NOT DETECTED
POC Marijuana UR: POSITIVE — AB
POC Methadone UR: NOT DETECTED
POC Methamphetamine UR: NOT DETECTED
POC Morphine: NOT DETECTED
POC Oxazepam (BZO): NOT DETECTED
POC Oxycodone UR: NOT DETECTED
POC Secobarbital (BAR): NOT DETECTED

## 2021-05-26 LAB — LIPID PANEL
Cholesterol: 201 mg/dL — ABNORMAL HIGH (ref 0–200)
HDL: 39 mg/dL — ABNORMAL LOW (ref >40)
LDL Cholesterol: 146 mg/dL — ABNORMAL HIGH (ref 0–99)
Total CHOL/HDL Ratio: 5.2 RATIO
Triglycerides: 80 mg/dL (ref <150)
VLDL: 16 mg/dL (ref 0–40)

## 2021-05-26 LAB — COMPREHENSIVE METABOLIC PANEL
ALT: 19 U/L (ref 0–44)
AST: 21 U/L (ref 15–41)
Albumin: 3.7 g/dL (ref 3.5–5.0)
Alkaline Phosphatase: 68 U/L (ref 38–126)
Anion gap: 8 (ref 5–15)
BUN: 6 mg/dL (ref 6–20)
CO2: 26 mmol/L (ref 22–32)
Calcium: 9.4 mg/dL (ref 8.9–10.3)
Chloride: 103 mmol/L (ref 98–111)
Creatinine, Ser: 0.76 mg/dL (ref 0.44–1.00)
GFR, Estimated: >60 mL/min (ref >60)
Glucose, Bld: 88 mg/dL (ref 70–99)
Potassium: 4.1 mmol/L (ref 3.5–5.1)
Sodium: 137 mmol/L (ref 135–145)
Total Bilirubin: 0.8 mg/dL (ref 0.3–1.2)
Total Protein: 7.3 g/dL (ref 6.5–8.1)

## 2021-05-26 LAB — HEMOGLOBIN A1C
Hgb A1c MFr Bld: 5.3 % (ref 4.8–5.6)
Mean Plasma Glucose: 105.41 mg/dL

## 2021-05-26 LAB — POCT PREGNANCY, URINE: Preg Test, Ur: NEGATIVE

## 2021-05-26 LAB — RESP PANEL BY RT-PCR (FLU A&B, COVID) ARPGX2
Influenza A by PCR: NEGATIVE
Influenza B by PCR: NEGATIVE
SARS Coronavirus 2 by RT PCR: NEGATIVE

## 2021-05-26 LAB — ETHANOL: Alcohol, Ethyl (B): <10 mg/dL (ref <10)

## 2021-05-26 LAB — TSH: TSH: 0.837 u[IU]/mL (ref 0.350–4.500)

## 2021-05-26 LAB — POC SARS CORONAVIRUS 2 AG -  ED: SARS Coronavirus 2 Ag: NEGATIVE

## 2021-05-26 MED ORDER — LORAZEPAM 1 MG PO TABS
1.0000 mg | ORAL_TABLET | Freq: Once | ORAL | Status: AC
  Administered 2021-05-26: 1 mg via ORAL
  Filled 2021-05-26: qty 1

## 2021-05-26 MED ORDER — LORAZEPAM 0.5 MG PO TABS: 0.5000 mg | ORAL_TABLET | Freq: Once | ORAL | Status: DC

## 2021-05-26 MED ORDER — MAGNESIUM OXIDE 400 MG PO TABS
200.0000 mg | ORAL_TABLET | Freq: Every day | ORAL | Status: DC
  Administered 2021-05-26: 200 mg via ORAL
  Filled 2021-05-26 (×4): qty 0.5

## 2021-05-26 MED ORDER — LURASIDONE HCL 20 MG PO TABS
20.0000 mg | ORAL_TABLET | Freq: Every day | ORAL | Status: DC
  Administered 2021-05-27: 20 mg via ORAL
  Filled 2021-05-26 (×3): qty 1

## 2021-05-26 MED ORDER — NORETHINDRONE 0.35 MG PO TABS: 1.0000 | ORAL_TABLET | Freq: Every day | ORAL | Status: DC

## 2021-05-26 MED ORDER — MAGNESIUM HYDROXIDE 400 MG/5ML PO SUSP: 30.0000 mL | Freq: Every day | ORAL | Status: DC | PRN

## 2021-05-26 MED ORDER — HYDROXYZINE HCL 25 MG PO TABS
25.0000 mg | ORAL_TABLET | Freq: Three times a day (TID) | ORAL | Status: DC | PRN
  Administered 2021-05-26: 25 mg via ORAL
  Filled 2021-05-26: qty 1

## 2021-05-26 MED ORDER — ALUM & MAG HYDROXIDE-SIMETH 200-200-20 MG/5ML PO SUSP: 30.0000 mL | ORAL | Status: DC | PRN

## 2021-05-26 MED ORDER — TRAZODONE HCL 50 MG PO TABS
50.0000 mg | ORAL_TABLET | Freq: Every evening | ORAL | Status: DC | PRN
  Administered 2021-05-26: 50 mg via ORAL
  Filled 2021-05-26: qty 1

## 2021-05-26 MED ORDER — MAGNESIUM OXIDE -MG SUPPLEMENT 400 (240 MG) MG PO TABS
200.0000 mg | ORAL_TABLET | Freq: Every day | ORAL | Status: DC
  Administered 2021-05-26: 200 mg via ORAL
  Filled 2021-05-26: qty 1
  Filled 2021-05-26: qty 0.5

## 2021-05-26 MED ORDER — ACETAMINOPHEN 325 MG PO TABS
650.0000 mg | ORAL_TABLET | Freq: Four times a day (QID) | ORAL | Status: DC | PRN
  Administered 2021-05-26: 650 mg via ORAL
  Filled 2021-05-26: qty 2

## 2021-05-26 MED ORDER — ACETAMINOPHEN 325 MG PO TABS
650.0000 mg | ORAL_TABLET | Freq: Four times a day (QID) | ORAL | Status: DC | PRN
  Administered 2021-05-26 – 2021-05-27 (×2): 650 mg via ORAL
  Filled 2021-05-26 (×2): qty 2

## 2021-05-26 MED ORDER — RIVAROXABAN 20 MG PO TABS
20.0000 mg | ORAL_TABLET | Freq: Every day | ORAL | Status: DC
  Administered 2021-05-26: 20 mg via ORAL
  Filled 2021-05-26 (×3): qty 1

## 2021-05-26 MED ORDER — ALBUTEROL SULFATE HFA 108 (90 BASE) MCG/ACT IN AERS: 2.0000 | INHALATION_SPRAY | Freq: Four times a day (QID) | RESPIRATORY_TRACT | Status: DC | PRN

## 2021-05-26 MED ORDER — LURASIDONE HCL 20 MG PO TABS
20.0000 mg | ORAL_TABLET | Freq: Every day | ORAL | Status: DC
  Administered 2021-05-26: 20 mg via ORAL
  Filled 2021-05-26: qty 1

## 2021-05-26 MED ORDER — LURASIDONE HCL 20 MG PO TABS: 20.0000 mg | ORAL_TABLET | Freq: Every day | ORAL | Status: DC

## 2021-05-26 MED ORDER — RIVAROXABAN 20 MG PO TABS: 20.0000 mg | ORAL_TABLET | Freq: Every day | ORAL | Status: DC
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
## 2021-05-26 NOTE — ED Notes (Signed)
[facility] locker - Best boy and ID

## 2021-05-26 NOTE — Progress Notes (Signed)
Patient information has been sent to [facility] via secure chat to review for potential admission. Patient meets inpatient criteria per Armando Reichert, MD.   Situation ongoing, CSW will continue to monitor progress.    Signed:  Mariea Clonts, MSW, LCSW-A  05/26/2021 9:49 AM

## 2021-05-26 NOTE — BH Assessment (Addendum)
Comprehensive Clinical Assessment (CCA) Screening, Triage and Referral Note  05/26/2021 Devra Kopinski [id number]  DISPOSITION: Lindon Romp, NP recommended inpatient psychiatric treatment. Per [facility], no appropriate bed at this time at [facility]. She stated she will have day shift review due to pt's medical history.   The patient demonstrates the following risk factors for suicide: Chronic risk factors for suicide include: psychiatric disorder of bipolar d/o, substance use disorder, previous suicide attempts multiple times, and medical illness of various kinds . Acute risk factors for suicide include: family or marital conflict and loss (financial, interpersonal, professional). Protective factors for this patient include: hope for the future. Considering these factors, the overall suicide risk at this point appears to be high. Patient is appropriate for outpatient follow up.  [facility] ED from 05/26/2021 in [facility] ED from 11/22/2020 in [place] High Risk No Risk      Pt came voluntarily and unaccompanied to the [facility] due to worsening depression and tonight, suicidal thoughts with thoughts of intentionally overdosing on her many prescription medications to kill herself. Pt also added that she sometimes stops taking her prescribed medication for her medication conditions such as blood thinners in hopes of dying. Hx of DVTs and PEs per chart. Pt reported she has attempted suicide three times at ages 30, 57 and 30 yo. Hx of Inpatient psychiatric admissions with the last about 7 years ago in [place]. Pt stated that tonight her boyfriend of about a year broke up with her just as she has recently learned some medical conditions she has have worsened and will require surgery. Pt reported that she has "cervical cancerous cells" that are scheduled to be removed, an aneurism that doctors are watching and is  pre-menopausal at 30 yo. Pt explained that she "has no one now" as her parents are dead and her only sister has a seizure and lost many memories from her past and now, does not feel connected to the pt. Hx of Bipolar disorder. Pt reported that she has not taken her mental health medications (Lithium) for about a year. Pt reported that she stopped seeing ehr therapist last September when she lost her job and her insurance. Since then, now employed, she has not been able to find another therapist with her current insurance. Pt denied HI, NSSH and AH. Pt reported periodically seeing a "vision" of "an old cowboy" since her childhood. He does not speak and is neither harmful or helpful to her. Pt reported daily use of marijuana via vaping and regularly use of alcohol drinking until she is intoxicated. At times pt uses mushrooms. 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 Sx of depression include feeling hopeless, helpless and worthless, decreased energy and over sleeping and over eating.  Patient was of tall stature, overweight and large build with normal grooming and casual dress. Posture/gait, movement, concentration, and memory within normal limits. Normal attention and concentration and oriented to person, time, place and situation. Mood was depressed and blunted and affect was congruent with mood. Normal eye contact and responsive facial expressions. Patient was cooperative and a bit guarded although forthcoming with information when asked. Speech, thought content and organization was within normal limits. Appeared to have average intelligence with poor judgment and insight but within normal limits for age.     Chief Complaint: No chief complaint on file.  Visit Diagnosis:  Bipolar disorder by  hx Cannabis Use disorder, severe Alcohol Use disorder, moderate  Patient Reported Information How did you hear about [place]? Self  What Is the Reason for Your  Visit/Call Today? Pt came voluntarily and unaccompanied to the [facility] due to worsening depression and tonight, suicidal thoughts with thoughts of intentionally overdosing on her many prescription medications to kill herself. Pt also added that she sometimes stops taking her prescribed medication for her medication conditions such as blood thinners in hopes of dying. Hx of DVTs and PEs per chart. Pt reported she has attempted suicide three times at ages 30, 30 and 30 yo. Hx of Inpatient psychiatric admissions with the last about 7 years ago in [place]. Pt stated that tonight her boyfriend of about a year broke up with her just as she has recently learned some medical conditions she has have worsened and will require surgery. Pt reported that she has "cervical cancerous cells" that are scheduled to be removed, an aneurism that doctors are watching and is pre-menopausal at 30 yo. Pt explained that she "has no one now" as her parents are dead and her only sister has a seizure and lost many memories from her past and now, does not feel connected to the pt. Hx of Bipolar disorder. Pt reported that she has not taken her mental health medications (Lithium) for about a year. Pt reported that she stopped seeing ehr therapist last September when she lost her job and her insurance. Since then, now employed, she has not been able to find another therapist with her current insurance. Pt denied HI, NSSH and AH. Pt reported periodically seeing a "vision" of "an old cowboy" since her childhood. He does not speak and is neither harmful or helpful to her. Pt reported daily use of marijuana via vaping and regularly use of alcohol drinking until she is intoxicated. At times pt uses mushrooms. 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 Sx of depression include feeling hopeless, helpless and worthless, decreased energy and over sleeping and over eating.  How Long Has This Been  Causing You Problems? > than 6 months  What Do You Feel Would Help You the Most Today? Treatment for Depression or other mood problem   Have You Recently Had Any Thoughts About Hurting Yourself? Yes  Are You Planning to Commit Suicide/Harm Yourself At This time? Yes   Have you Recently Had Thoughts About Hurting Someone [place]? No  Are You Planning to Harm Someone at This Time? No  Explanation: No data recorded  Have You Used Any Alcohol or Drugs in the Past 24 Hours? Yes  How Long Ago Did You Use Drugs or Alcohol? No data recorded What Did You Use and How Much? marijuana and alcohol   Do You Currently Have a Therapist/Psychiatrist? No  Name of Therapist/Psychiatrist: No data recorded  Have You Been Recently Discharged From Any Office Practice or Programs? No  Explanation of Discharge From Practice/Program: No data recorded   CCA Screening Triage Referral Assessment Type of Contact: Face-to-Face  Telemedicine Service Delivery:   Is this Initial or Reassessment? No data recorded Date Telepsych consult ordered in [facility]:  No data recorded Time Telepsych consult ordered in [facility]:  No data recorded Location of Assessment: [facility] Assessment Services  Provider Location: [facility] Assessment Services   Collateral Involvement: none   Does Patient Have a [place]? No data recorded Name and Contact of Legal Guardian: No data recorded If Minor  and Not Living with Parent(s), Who has Custody? No data recorded Is CPS involved or ever been involved? -- (UTA)  Is APS involved or ever been involved? -- (UTA)   Patient Determined To Be At Risk for Harm To Self or Others Based on Review of Patient Reported Information or Presenting Complaint? Yes, for Self-Harm  Method: No data recorded Availability of Means: No data recorded Intent: No data recorded Notification Required: No data recorded Additional Information for Danger to Others Potential: No data  recorded Additional Comments for Danger to Others Potential: No data recorded Are There Guns or Other Weapons in Your Home? No data recorded Types of Guns/Weapons: No data recorded Are These Weapons Safely Secured?                            No data recorded Who Could Verify You Are Able To Have These Secured: No data recorded Do You Have any Outstanding Charges, Pending Court Dates, Parole/Probation? No data recorded Contacted To Inform of Risk of Harm To Self or Others: No data recorded  Does Patient Present under Involuntary Commitment? No  IVC Papers Initial File Date: No data recorded  [place] of Residence: [place]   Patient Currently Receiving the Following Services: No data recorded  Determination of Need: Emergent (2 hours) Lindon Romp, NP recommended inpatient psychiatric treatment.)   Options For Referral: Inpatient Hospitalization   Discharge Disposition:     Fuller Mandril, Counselor  Isha Seefeld T. Mare Ferrari, [place], [facility], [facility] Triage Specialist [facility]

## 2021-05-26 NOTE — ED Notes (Signed)
Report called to [place] at [facility]. EMTALA completed and placed in envelope to be sent with Pt to [facility]. Science writer for transportation services.

## 2021-05-26 NOTE — Tx Team (Signed)
Initial Treatment Plan 05/26/2021 3:36 PM [name] [id number]    PATIENT STRESSORS: Marital or family conflict Medication change or noncompliance Substance abuse   PATIENT STRENGTHS: Ability for insight Communication skills General fund of knowledge Motivation for treatment/growth Work skills   PATIENT IDENTIFIED PROBLEMS: "Depression"   "At risk for suicide"   "No support"   "Medical issues"                DISCHARGE CRITERIA:  Ability to meet basic life and health needs Improved stabilization in mood, thinking, and/or behavior Verbal commitment to aftercare and medication compliance  PRELIMINARY DISCHARGE PLAN: Outpatient therapy Return to previous living arrangement Return to previous work or school arrangements  PATIENT/FAMILY INVOLVEMENT: This treatment plan has been presented to and reviewed with the patient, Nina Morrow, and/or family member. The patient and family have been given the opportunity to ask questions and make suggestions.  Lonia Skinner, RN 05/26/2021, 3:36 PM

## 2021-05-26 NOTE — ED Notes (Signed)
Pt given breakfast.
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
## 2021-05-26 NOTE — ED Notes (Signed)
Discharge instructions received and Pt stated understanding. Pt alert, orient and ambulatory upon d/c from facility. Personal belongings returned from locker. Pt escorted to the sally port to meet safe transport services to [facility]. Safety maintained.

## 2021-05-26 NOTE — Discharge Instructions (Addendum)
Pt  has been accepted to 304-1 to the services of Dr. Mallie Darting.
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
## 2021-05-26 NOTE — ED Notes (Signed)
Pt A&O x 4, presents with depression, hopelessness and suicidal thoughts.  Plan to take overdose of home medications.  Recent breakup with boyfriend, now feels she doesn't have anyone to depend on or there for her.  Denies HI or auditory hallucinations.  Pt calm & cooperative with flat affect, no distress noted.  Skin search completed, Monitoring for safety.

## 2021-05-26 NOTE — ED Notes (Signed)
[facility] called for services.

## 2021-05-26 NOTE — Progress Notes (Signed)
Pt accepted to [facility] 304-1   Patient meets inpatient criteria per Armando Reichert, MD  Dr.Greg Mallie Darting is the attending provider.    Call report to [phone]    Leota Jacobsen, LPN @ [facility] notified.     Pt scheduled  to arrive at [facility] today after 1300.   Mariea Clonts, MSW, LCSW-A  1:08 PM 05/26/2021

## 2021-05-27 DIAGNOSIS — F3181 Bipolar II disorder: Principal | ICD-10-CM

## 2021-05-27 MED ORDER — LURASIDONE HCL 20 MG PO TABS: 20.0000 mg | ORAL_TABLET | Freq: Every day | ORAL | 0 refills | Status: DC
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
## 2021-05-27 NOTE — BHH Suicide Risk Assessment (Signed)
[facility] Admission Suicide Risk Assessment   Nursing information obtained from:  Patient Demographic factors:  Living alone Current Mental Status:  Suicide plan, Suicidal ideation indicated by patient Loss Factors:  Decline in physical health, Loss of significant relationship Historical Factors:  Prior suicide attempts Risk Reduction Factors:  Employed  Total Time spent with patient: 45 minutes Principal Problem: <principal problem not specified> Diagnosis:  Active Problems:   Bipolar 2 disorder, major depressive episode ([place])  Subjective Data: Patient is seen and examined.  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  Patient stated that he had had a problem with her significant boyfriend.  They had an argument and broke up on the night of admission.  She stated that she had had worsening depression and suicidal ideation.  It was noted in the evaluation that throughout the interview she was pleasant, cooperative, and not really showing a great deal of specific depressive symptoms.  She stated that she had been diagnosed with bipolar disorder in the past as well as PMDD.  She stated that she felt as though her depression worsened because of the PMDD, and and she had started her menstrual period the day that I had evaluated her.  On on my examination today she stated that she was not suicidal, not homicidal, not depressed.  She stated that she had come in the hospital voluntarily and requested to be discharged today.  She had been previously treated with lithium as she recalls, and review of the electronic medical record also revealed that she had been treated with Seroquel as well as Geodon in the past.  She  stated she did not feel that any of the medications she had been on previously were effective in helping her depression.  She stated that her anger episodes were almost minute to minute or hour to hour.  They were not over an extended period of time, and as well seem to be related to specific instances.  She has multiple medical problems including vasovagal disorders which leads to syncope, history of DVT, normocytic anemia as well as pulmonary emboli.  She is on Xarelto chronically.  Her syncopal episodes have been worked up by neurology as well as cardiology.  She also reported a history of polycystic ovary disease.  She had been started on Latuda 20 mg p.o. daily per the behavioral health urgent care center.  Continued Clinical Symptoms:  Alcohol Use Disorder Identification Test Final Score (AUDIT): 0 The "Alcohol Use Disorders Identification Test", Guidelines for Use in Primary Care, Second Edition.  World Pharmacologist [facility]). Score between 0-7:  no or low risk or alcohol related problems. Score between 8-15:  moderate risk of alcohol related problems. Score between 16-19:  high risk of alcohol related problems. Score 20 or above:  warrants further diagnostic evaluation for alcohol dependence and treatment.   CLINICAL FACTORS:   Bipolar Disorder:   Bipolar II Personality Disorders:   Cluster B Chronic Pain More than one psychiatric diagnosis Previous Psychiatric Diagnoses and Treatments Medical Diagnoses and Treatments/Surgeries   Musculoskeletal: Strength & Muscle Tone: within normal limits Gait & Station: normal Patient leans: N/A  Psychiatric Specialty Exam:  Presentation  General Appearance: Fairly Groomed  Eye Contact:Fair  Speech:Normal Rate  Speech Volume:Normal  Handedness:Right   Mood  and Affect  Mood:Euthymic  Affect:Congruent   Thought Process  Thought Processes:Coherent  Descriptions of Associations:Intact  Orientation:Full (Time, Place and  Person)  Thought Content:Logical  History of Schizophrenia/Schizoaffective disorder:No  Duration of Psychotic Symptoms:Greater than six months  Hallucinations:Hallucinations: None Description of Visual Hallucinations: reports that she intermittently sees an old fashioned cowboy.  Ideas of Reference:None  Suicidal Thoughts:Suicidal Thoughts: No SI Active Intent and/or Plan: With Plan; With Intent; With Means to Carry Out  Homicidal Thoughts:Homicidal Thoughts: No   Sensorium  Memory:Immediate Fair; Recent Fair; Remote Fair  Judgment:Fair  Insight:Present   Executive Functions  Concentration:Fair  Attention Span:Fair  [place]   Psychomotor Activity  Psychomotor Activity:Psychomotor Activity: Normal   Assets  Assets:Desire for Improvement; Resilience   Sleep  Sleep:Sleep: Fair    Physical Exam: Physical Exam Vitals and nursing note reviewed.  Constitutional:      Appearance: Normal appearance.  HENT:     Head: Normocephalic and atraumatic.  Pulmonary:     Effort: Pulmonary effort is normal.  Neurological:     General: No focal deficit present.     Mental Status: She is alert and oriented to person, place, and time.   Review of Systems  All other systems reviewed and are negative. Blood pressure 130/74, pulse (!) 112, temperature 98.5 F (36.9 C), temperature source Oral, resp. rate 18, height 6' (1.829 m), weight 118.8 kg, SpO2 100 %. Body mass index is 35.53 kg/m.   COGNITIVE FEATURES THAT CONTRIBUTE TO RISK:  None    SUICIDE RISK:   Minimal: No identifiable suicidal ideation.  Patients presenting with no risk factors but with morbid ruminations; may be classified as minimal risk based on the severity of the depressive symptoms  PLAN OF CARE: Patient is seen and examined.  Patient is a 30 year old female with the above-stated past psychiatric history who was admitted from the urgent care center.   She is here on a voluntary basis and request to be able to go home today.  She stated she started her menstrual period today, and that that has basically resolved all of her depression and suicidal ideation.  We will continue her Latuda, but plan to discharge her this afternoon after she is monitored.  Review of her admission laboratories revealed normal electrolytes with a creatinine is 0.76.  Liver function enzymes were normal.  Lipid panel was mildly abnormal with a slightly elevated total cholesterol and LDL.  She had an iron deficiency anemia panel done on 7/15 that showed a low normal iron count, and a low ferritin.  B12 was normal at 177.  Her white blood cell count is slightly low at 3.0.  MCV  was normal and the rest of her indices including platelets were normal.  Differential was normal.  Pregnancy test was negative, TSH was normal at 0.837.  Respiratory panel was negative.  Drug screen was positive for marijuana, blood alcohol was negative.  Her blood pressure stable, and she is mildly tachycardic at a rate of 112.  We will plan on discharge this afternoon.  I certify that inpatient services furnished can reasonably be expected to improve the patient's condition.   Sharma Covert, MD 05/27/2021, 12:48 PM

## 2021-05-27 NOTE — Progress Notes (Signed)
Pt discharged to lobby. [facility] present to take pt to [facility] to pick up her car. Pt was stable and appreciative at that time. All papers were given and valuables returned. Verbal understanding expressed. Denies SI/HI and A/VH. Pt given opportunity to express concerns and ask questions.

## 2021-05-27 NOTE — H&P (Signed)
Psychiatric Admission Assessment Adult  Patient Identification: Mar Schwabauer MRN:  [id number] Date of Evaluation:  05/27/2021 Chief Complaint:  Bipolar 2 disorder, major depressive episode ([place]) [F31.81] Principal Diagnosis: Bipolar 2 disorder, major depressive episode ([place]) Diagnosis:  Principal Problem:   Bipolar 2 disorder, major depressive episode ([place])  History of Present Illness: Patient is seen and examined.  Patient is a 30 year old female with a reported past psychiatric history significant for probable bipolar disorder type II, reported premenstrual dysphoric disorder, posttraumatic stress disorder and as well suspected cluster B personality disorder who presented to the [facility] on 05/26/2021 with suicidal ideation.  Patient stated that he had had a problem with her significant boyfriend.  They had an argument and broke up on the night of admission.  She stated that she had had worsening depression and suicidal ideation.  It was noted in the evaluation that throughout the interview she was pleasant, cooperative, and not really showing a great deal of specific depressive symptoms.  She stated that she had been diagnosed with bipolar disorder in the past as well as PMDD.  She stated that she felt as though her depression worsened because of the PMDD, and and she had started her menstrual period the day that I had evaluated her.  On on my examination today she stated that she was not suicidal, not homicidal, not depressed.  She stated that she had come in the hospital voluntarily and requested to be discharged today.  She had been previously treated with lithium as she recalls, and review of the electronic medical record also revealed that she had been treated with Seroquel as well as Geodon in the past.  She stated she did not feel that any of the medications she had been on previously were effective in helping her depression.  She stated that her anger episodes were  almost minute to minute or hour to hour.  They were not over an extended period of time, and as well seem to be related to specific instances.  She has multiple medical problems including vasovagal disorders which leads to syncope, history of DVT, normocytic anemia as well as pulmonary emboli.  She is on Xarelto chronically.  Her syncopal episodes have been worked up by neurology as well as cardiology.  She also reported a history of polycystic ovary disease.  She had been started on Latuda 20 mg p.o. daily per the behavioral health urgent care center.  Associated Signs/Symptoms: Depression Symptoms:  depressed mood, anhedonia, psychomotor agitation, suicidal thoughts without plan, loss of energy/fatigue, Duration of Depression Symptoms: Greater than two weeks  (Hypo) Manic Symptoms:  Impulsivity, Irritable Mood, Labiality of Mood, Anxiety Symptoms:  Excessive Worry, Psychotic Symptoms:   Denied PTSD Symptoms: Negative Total Time spent with patient: 45 minutes  Past Psychiatric History: Patient had been admitted to the psychiatric hospital in the past for previous suicidal ideation.  She had been [facility] at the [facility].  She has been previously treated with Seroquel, Geodon and she stated multiple antidepressants without effect.  She was started on Latuda at the behavioral health urgent care center.  She stated she has a history of unspecified bipolar disorder as well as premenstrual dysphoric disorder.  Is the patient at risk to self? No.  Has the patient been a risk to self in the past 6 months? Yes.    Has the patient been a risk to self within the distant past? No.  Is the patient a risk to  others? No.  Has the patient been a risk to others in the past 6 months? No.  Has the patient been a risk to others within the distant past? No.   Prior Inpatient Therapy:   Prior Outpatient Therapy:    Alcohol Screening: Patient refused Alcohol Screening Tool:  Yes 1. How often do you have a drink containing alcohol?: Never 2. How many drinks containing alcohol do you have on a typical day when you are drinking?: 1 or 2 3. How often do you have six or more drinks on one occasion?: Never AUDIT-C Score: 0 4. How often during the last year have you found that you were not able to stop drinking once you had started?: Never 5. How often during the last year have you failed to do what was normally expected from you because of drinking?: Never 6. How often during the last year have you needed a first drink in the morning to get yourself going after a heavy drinking session?: Never 7. How often during the last year have you had a feeling of guilt of remorse after drinking?: Never 8. How often during the last year have you been unable to remember what happened the night before because you had been drinking?: Never 9. Have you or someone else been injured as a result of your drinking?: No 10. Has a relative or friend or a doctor or another health worker been concerned about your drinking or suggested you cut down?: No Alcohol Use Disorder Identification Test Final Score (AUDIT): 0 Substance Abuse History in the last 12 months:  No. Consequences of Substance Abuse: Negative Previous Psychotropic Medications: Yes  Psychological Evaluations: Yes  Past Medical History:  Past Medical History:  Diagnosis Date   Bipolar 1 disorder ([place])    with depression and anxiety. stopped taking lithium december 2021.    DVT (deep venous thrombosis) (HCC)    Hallucination    Palpitation    PCOS (polycystic ovarian syndrome)    Pulmonary emboli (HCC)    Syncope and collapse     Past Surgical History:  Procedure Laterality Date   BARIATRIC SURGERY     TONSILLECTOMY     Family History:  Family History  Problem Relation Age of Onset   Transient ischemic attack Mother        79   Pulmonary embolism Sister        May 2021   Breast cancer Maternal Grandmother     Stomach cancer Maternal Grandfather    Lung cancer Maternal Grandfather    Breast cancer Cousin    Family Psychiatric  History: Noncontributory Tobacco Screening:   Social History:  Social History   Substance and Sexual Activity  Alcohol Use Yes   Comment: socially. 1-2 drinks/month     Social History   Substance and Sexual Activity  Drug Use Yes   Types: Marijuana   Comment: vapes TH; occasional mushrooms    Additional Social History: Marital status: Single Are you sexually active?: Yes What is your sexual orientation?: " I am straight" Has your sexual activity been affected by drugs, alcohol, medication, or emotional stress?: " Yes, i have to use drugs to have sex because of the amount of pain I am in from intercourse" Does patient have children?: No  Specify valuables returned: all items listed on pt's beloingings sheet                      Allergies:   Allergies  Allergen  Reactions   Aripiprazole Other (See Comments)    Light headed, SOB, diarrhea, vomiting   Grass Extracts [Gramineae Pollens] Other (See Comments)    Coughing, sneezing   Latex Hives and Rash   Lab Results:  Results for orders placed or performed during the hospital encounter of 05/26/21 (from the past 48 hour(s))  Resp Panel by RT-PCR (Flu A&B, Covid) Nasopharyngeal Swab     Status: None   Collection Time: 05/26/21  2:54 AM   Specimen: Nasopharyngeal Swab; Nasopharyngeal(NP) swabs in vial transport medium  Result Value Ref Range   SARS Coronavirus 2 by RT PCR NEGATIVE NEGATIVE    Comment: (NOTE) SARS-CoV-2 target nucleic acids are NOT DETECTED.  The SARS-CoV-2 RNA is generally detectable in upper respiratory specimens during the acute phase of infection. The lowest concentration of SARS-CoV-2 viral copies this assay can detect is 138 copies/mL. A negative result does not preclude SARS-Cov-2 infection and should not be used as the sole basis for treatment or other patient management  decisions. A negative result may occur with  improper specimen collection/handling, submission of specimen other than nasopharyngeal swab, presence of viral mutation(s) within the areas targeted by this assay, and inadequate number of viral copies(<138 copies/mL). A negative result must be combined with clinical observations, patient history, and epidemiological information. The expected result is Negative.  Fact Sheet for Patients:  EntrepreneurPulse.com.au  Fact Sheet for Healthcare Providers:  IncredibleEmployment.be  This test is no t yet approved or cleared by the [place] FDA and  has been authorized for detection and/or diagnosis of SARS-CoV-2 by FDA under an Emergency Use Authorization (EUA). This EUA will remain  in effect (meaning this test can be used) for the duration of the COVID-19 declaration under Section 564(b)(1) of the Act, 21 U.S.C.section 360bbb-3(b)(1), unless the authorization is terminated  or revoked sooner.       Influenza A by PCR NEGATIVE NEGATIVE   Influenza B by PCR NEGATIVE NEGATIVE    Comment: (NOTE) The Xpert Xpress SARS-CoV-2/FLU/RSV plus assay is intended as an aid in the diagnosis of influenza from Nasopharyngeal swab specimens and should not be used as a sole basis for treatment. Nasal washings and aspirates are unacceptable for Xpert Xpress SARS-CoV-2/FLU/RSV testing.  Fact Sheet for Patients: EntrepreneurPulse.com.au  Fact Sheet for Healthcare Providers: IncredibleEmployment.be  This test is not yet approved or cleared by the [place] FDA and has been authorized for detection and/or diagnosis of SARS-CoV-2 by FDA under an Emergency Use Authorization (EUA). This EUA will remain in effect (meaning this test can be used) for the duration of the COVID-19 declaration under Section 564(b)(1) of the Act, 21 U.S.C. section 360bbb-3(b)(1), unless the authorization  is terminated or revoked.  Performed at [facility], [place] [address] [place], [place] [address]   POC SARS Coronavirus 2 Ag-ED - Nasal Swab     Status: None (Preliminary result)   Collection Time: 05/26/21  2:54 AM  Result Value Ref Range   SARS Coronavirus 2 Ag Negative Negative  CBC with Differential/Platelet     Status: Abnormal   Collection Time: 05/26/21  2:55 AM  Result Value Ref Range   WBC 3.0 (L) 4.0 - 10.5 K/uL   RBC 4.95 3.87 - 5.11 MIL/uL   Hemoglobin 12.4 12.0 - 15.0 g/dL   HCT 39.9 36.0 - 46.0 %   MCV 80.6 80.0 - 100.0 fL   MCH 25.1 (L) 26.0 - 34.0 pg   MCHC 31.1 30.0 - 36.0 g/dL  RDW 17.2 (H) 11.5 - 15.5 %   Platelets 307 150 - 400 K/uL   nRBC 0.0 0.0 - 0.2 %   Neutrophils Relative % 43 %   Neutro Abs 1.3 (L) 1.7 - 7.7 K/uL   Lymphocytes Relative 47 %   Lymphs Abs 1.4 0.7 - 4.0 K/uL   Monocytes Relative 7 %   Monocytes Absolute 0.2 0.1 - 1.0 K/uL   Eosinophils Relative 2 %   Eosinophils Absolute 0.1 0.0 - 0.5 K/uL   Basophils Relative 1 %   Basophils Absolute 0.0 0.0 - 0.1 K/uL   Immature Granulocytes 0 %   Abs Immature Granulocytes 0.00 0.00 - 0.07 K/uL    Comment: Performed at [facility] [address] [place], [place] [address]  Comprehensive metabolic panel     Status: None   Collection Time: 05/26/21  2:55 AM  Result Value Ref Range   Sodium 137 135 - 145 mmol/L   Potassium 4.1 3.5 - 5.1 mmol/L   Chloride 103 98 - 111 mmol/L   CO2 26 22 - 32 mmol/L   Glucose, Bld 88 70 - 99 mg/dL    Comment: Glucose reference range applies only to samples taken after fasting for at least 8 hours.   BUN 6 6 - 20 mg/dL   Creatinine, Ser 0.76 0.44 - 1.00 mg/dL   Calcium 9.4 8.9 - 10.3 mg/dL   Total Protein 7.3 6.5 - 8.1 g/dL   Albumin 3.7 3.5 - 5.0 g/dL   AST 21 15 - 41 U/L   ALT 19 0 - 44 U/L   Alkaline Phosphatase 68 38 - 126 U/L   Total Bilirubin 0.8 0.3 - 1.2 mg/dL   GFR, Estimated >60 >60 mL/min    Comment: (NOTE) Calculated using the  CKD-EPI Creatinine Equation (2021)    Anion gap 8 5 - 15    Comment: Performed at [facility] [address] [place], [place] [address]  Hemoglobin A1c     Status: None   Collection Time: 05/26/21  2:55 AM  Result Value Ref Range   Hgb A1c MFr Bld 5.3 4.8 - 5.6 %    Comment: (NOTE) Pre diabetes:          5.7%-6.4%  Diabetes:              >6.4%  Glycemic control for   <7.0% adults with diabetes    Mean Plasma Glucose 105.41 mg/dL    Comment: Performed at [facility] [address], [place], [place] [address]  Ethanol     Status: None   Collection Time: 05/26/21  2:55 AM  Result Value Ref Range   Alcohol, Ethyl (B) <10 <10 mg/dL    Comment: (NOTE) Lowest detectable limit for serum alcohol is 10 mg/dL.  For medical purposes only. Performed at [facility], [place] [address] [place],  [address]   Lipid panel     Status: Abnormal   Collection Time: 05/26/21  2:55 AM  Result Value Ref Range   Cholesterol 201 (H) 0 - 200 mg/dL   Triglycerides 80 <150 mg/dL   HDL 39 (L) >40 mg/dL   Total CHOL/HDL Ratio 5.2 RATIO   VLDL 16 0 - 40 mg/dL   LDL Cholesterol 146 (H) 0 - 99 mg/dL    Comment:        Total Cholesterol/HDL:CHD Risk Coronary Heart Disease Risk Table  Men   Women  1/2 Average Risk   3.4   3.3  Average Risk       5.0   4.4  2 X Average Risk   9.6   7.1  3 X Average Risk  23.4   11.0        Use the calculated Patient Ratio above and the CHD Risk Table to determine the patient's CHD Risk.        ATP III CLASSIFICATION (LDL):  <100     mg/dL   Optimal  100-129  mg/dL   Near or Above                    Optimal  130-159  mg/dL   Borderline  160-189  mg/dL   High  >190     mg/dL   Very High Performed at [facility] [address], [place], [place] [address]   TSH     Status: None   Collection Time: 05/26/21  2:55 AM  Result Value Ref Range   TSH 0.837 0.350 - 4.500 uIU/mL    Comment: Performed by a 3rd  Generation assay with a functional sensitivity of <=0.01 uIU/mL. Performed at [facility], [place] [address], [place], [place] [address]   POCT Urine Drug Screen - ([facility])     Status: Abnormal (Preliminary result)   Collection Time: 05/26/21  2:57 AM  Result Value Ref Range   POC Amphetamine UR None Detected NONE DETECTED (Cut Off Level 1000 ng/mL)   POC Secobarbital (BAR) None Detected NONE DETECTED (Cut Off Level 300 ng/mL)   POC Buprenorphine (BUP) None Detected NONE DETECTED (Cut Off Level 10 ng/mL)   POC Oxazepam (BZO) None Detected NONE DETECTED (Cut Off Level 300 ng/mL)   POC Cocaine UR None Detected NONE DETECTED (Cut Off Level 300 ng/mL)   POC Methamphetamine UR None Detected NONE DETECTED (Cut Off Level 1000 ng/mL)   POC Morphine None Detected NONE DETECTED (Cut Off Level 300 ng/mL)   POC Oxycodone UR None Detected NONE DETECTED (Cut Off Level 100 ng/mL)   POC Methadone UR None Detected NONE DETECTED (Cut Off Level 300 ng/mL)   POC Marijuana UR Positive (A) NONE DETECTED (Cut Off Level 50 ng/mL)  Pregnancy, urine POC     Status: None   Collection Time: 05/26/21  3:01 AM  Result Value Ref Range   Preg Test, Ur NEGATIVE NEGATIVE    Comment:        THE SENSITIVITY OF THIS METHODOLOGY IS >24 mIU/mL     Blood Alcohol level:  Lab Results  Component Value Date   ETH <10 123456    Metabolic Disorder Labs:  Lab Results  Component Value Date   HGBA1C 5.3 05/26/2021   MPG 105.41 05/26/2021   No results found for: PROLACTIN Lab Results  Component Value Date   CHOL 201 (H) 05/26/2021   TRIG 80 05/26/2021   HDL 39 (L) 05/26/2021   CHOLHDL 5.2 05/26/2021   VLDL 16 05/26/2021   LDLCALC 146 (H) 05/26/2021    Current Medications: Current Facility-Administered Medications  Medication Dose Route Frequency Provider Last Rate Last Admin   acetaminophen (TYLENOL) tablet 650 mg  650 mg Oral Q6H PRN Doda, Vandana, MD   650 mg at 05/27/21 1119   albuterol (VENTOLIN HFA)  108 (90 Base) MCG/ACT inhaler 2 puff  2 puff Inhalation Q6H PRN Armando Reichert, MD       alum & mag hydroxide-simeth (MAALOX/MYLANTA)  200-200-20 MG/5ML suspension 30 mL  30 mL Oral Q4H PRN Armando Reichert, MD       hydrOXYzine (ATARAX/VISTARIL) tablet 25 mg  25 mg Oral TID PRN Armando Reichert, MD   25 mg at 05/26/21 2124   lurasidone (LATUDA) tablet 20 mg  20 mg Oral Q breakfast Armando Reichert, MD   20 mg at 05/27/21 0809   magnesium hydroxide (MILK OF MAGNESIA) suspension 30 mL  30 mL Oral Daily PRN Armando Reichert, MD       magnesium oxide (MAG-OX) tablet 200 mg  200 mg Oral Daily Doda, Vandana, MD   200 mg at 05/26/21 1615   norethindrone (MICRONOR) 0.35 MG tablet 0.35 mg  1 tablet Oral Daily Doda, Vandana, MD       rivaroxaban (XARELTO) tablet 20 mg  20 mg Oral Q supper Armando Reichert, MD   20 mg at 05/26/21 1704   traZODone (DESYREL) tablet 50 mg  50 mg Oral QHS PRN Armando Reichert, MD   50 mg at 05/26/21 2124   Current Outpatient Medications  Medication Sig Dispense Refill   albuterol (VENTOLIN HFA) 108 (90 Base) MCG/ACT inhaler Inhale into the lungs. prn     norethindrone (MICRONOR) 0.35 MG tablet Take 1 tablet by mouth daily.     rivaroxaban (XARELTO) 20 MG TABS tablet Take 1 tablet (20 mg total) by mouth daily with supper. 30 tablet 3   lurasidone (LATUDA) 20 MG TABS tablet Take 1 tablet (20 mg total) by mouth daily with breakfast. For mood control 30 tablet 0   PTA Medications: No medications prior to admission.    Musculoskeletal: Strength & Muscle Tone: within normal limits Gait & Station: normal Patient leans: N/A            Psychiatric Specialty Exam:  Presentation  General Appearance: Fairly Groomed  Eye Contact:Fair  Speech:Normal Rate  Speech Volume:Normal  Handedness:Right   Mood and Affect  Mood:Euthymic  Affect:Congruent   Thought Process  Thought Processes:Coherent  Duration of Psychotic Symptoms: Greater than six months  Past Diagnosis of  Schizophrenia or Psychoactive disorder: No  Descriptions of Associations:Intact  Orientation:Full (Time, Place and Person)  Thought Content:Logical  Hallucinations:Hallucinations: None Description of Visual Hallucinations: reports that she intermittently sees an old fashioned cowboy.  Ideas of Reference:None  Suicidal Thoughts:Suicidal Thoughts: No SI Active Intent and/or Plan: With Plan; With Intent; With Means to Carry Out  Homicidal Thoughts:Homicidal Thoughts: No   Sensorium  Memory:Immediate Fair; Recent Fair; Remote Fair  Judgment:Fair  Insight:Present   Executive Functions  Concentration:Fair  Attention Span:Fair  [place]   Psychomotor Activity  Psychomotor Activity:Psychomotor Activity: Normal   Assets  Assets:Desire for Improvement; Resilience   Sleep  Sleep:Sleep: Fair    Physical Exam: Physical Exam Vitals and nursing note reviewed.  HENT:     Head: Normocephalic and atraumatic.  Pulmonary:     Effort: Pulmonary effort is normal.  Neurological:     General: No focal deficit present.     Mental Status: She is alert and oriented to person, place, and time.   Review of Systems  All other systems reviewed and are negative. Blood pressure 130/74, pulse (!) 112, temperature 98.5 F (36.9 C), temperature source Oral, resp. rate 18, height 6' (1.829 m), weight 118.8 kg, SpO2 100 %. Body mass index is 35.53 kg/m.  Treatment Plan Summary: Patient is seen and examined.  Patient is a 30 year old female with the above-stated past psychiatric history who was  admitted from the urgent care center.  She is here on a voluntary basis and request to be able to go home today.  She stated she started her menstrual period today, and that that has basically resolved all of her depression and suicidal ideation.  We will continue her Latuda, but plan to discharge her this afternoon after she is monitored.  Review of her  admission laboratories revealed normal electrolytes with a creatinine is 0.76.  Liver function enzymes were normal.  Lipid panel was mildly abnormal with a slightly elevated total cholesterol and LDL.  She had an iron deficiency anemia panel done on 7/15 that showed a low normal iron count, and a low ferritin.  B12 was normal at 177.  Her white blood cell count is slightly low at 3.0.  MCV  was normal and the rest of her indices including platelets were normal.  Differential was normal.  Pregnancy test was negative, TSH was normal at 0.837.  Respiratory panel was negative.  Drug screen was positive for marijuana, blood alcohol was negative.  Her blood pressure stable, and she is mildly tachycardic at a rate of 112.  We will plan on discharge this afternoon.  Observation Level/Precautions:  15 minute checks  Laboratory:  CBC Chemistry Profile HCG UDS UA  Psychotherapy:    Medications:    Consultations:    Discharge Concerns:    Estimated LOS:  Other:     Physician Treatment Plan for Primary Diagnosis: Bipolar 2 disorder, major depressive episode ([place]) Long Term Goal(s): Improvement in symptoms so as ready for discharge  Short Term Goals: Ability to identify changes in lifestyle to reduce recurrence of condition will improve, Ability to verbalize feelings will improve, Ability to demonstrate self-control will improve, Ability to identify and develop effective coping behaviors will improve, and Ability to maintain clinical measurements within normal limits will improve  Physician Treatment Plan for Secondary Diagnosis: Principal Problem:   Bipolar 2 disorder, major depressive episode ([place])  Long Term Goal(s): Improvement in symptoms so as ready for discharge  Short Term Goals: Ability to identify changes in lifestyle to reduce recurrence of condition will improve, Ability to verbalize feelings will improve, Ability to demonstrate self-control will improve, Ability to identify and develop  effective coping behaviors will improve, and Ability to maintain clinical measurements within normal limits will improve  I certify that inpatient services furnished can reasonably be expected to improve the patient's condition.    Sharma Covert, MD 7/30/20225:26 PM

## 2021-05-27 NOTE — [facility] Group Notes (Signed)
Pt did not attend goals group. 

## 2021-05-27 NOTE — BHH Suicide Risk Assessment (Signed)
[facility] Discharge Suicide Risk Assessment   Principal Problem: <principal problem not specified> Discharge Diagnoses: Active Problems:   Bipolar 2 disorder, major depressive episode ([place])   Total Time spent with patient: 30 minutes  Musculoskeletal: Strength & Muscle Tone: within normal limits Gait & Station: normal Patient leans: N/A  Psychiatric Specialty Exam  Presentation  General Appearance: Fairly Groomed  Eye Contact:Fair  Speech:Normal Rate  Speech Volume:Normal  Handedness:Right   Mood and Affect  Mood:Euthymic  Duration of Depression Symptoms: Greater than two weeks  Affect:Congruent   Thought Process  Thought Processes:Coherent  Descriptions of Associations:Intact  Orientation:Full (Time, Place and Person)  Thought Content:Logical  History of Schizophrenia/Schizoaffective disorder:No  Duration of Psychotic Symptoms:Greater than six months  Hallucinations:Hallucinations: None Description of Visual Hallucinations: reports that she intermittently sees an old fashioned cowboy.  Ideas of Reference:None  Suicidal Thoughts:Suicidal Thoughts: No SI Active Intent and/or Plan: With Plan; With Intent; With Means to Carry Out  Homicidal Thoughts:Homicidal Thoughts: No   Sensorium  Memory:Immediate Fair; Recent Fair; Remote Fair  Judgment:Fair  Insight:Present   Executive Functions  Concentration:Fair  Attention Span:Fair  [place]   Psychomotor Activity  Psychomotor Activity:Psychomotor Activity: Normal   Assets  Assets:Desire for Improvement; Resilience   Sleep  Sleep:Sleep: Fair   Physical Exam: Physical Exam Vitals and nursing note reviewed.  Constitutional:      Appearance: Normal appearance. She is obese.  HENT:     Head: Normocephalic and atraumatic.  Pulmonary:     Effort: Pulmonary effort is normal.  Neurological:     General: No focal deficit present.     Mental  Status: She is alert and oriented to person, place, and time.   Review of Systems  Genitourinary:  Positive for flank pain.  Musculoskeletal:  Positive for myalgias.  All other systems reviewed and are negative. Blood pressure 130/74, pulse (!) 112, temperature 98.5 F (36.9 C), temperature source Oral, resp. rate 18, height 6' (1.829 m), weight 118.8 kg, SpO2 100 %. Body mass index is 35.53 kg/m.  Mental Status Per Nursing Assessment::   On Admission:  Suicide plan, Suicidal ideation indicated by patient  Demographic Factors:  Low socioeconomic status and Living alone  Loss Factors: Loss of significant relationship  Historical Factors: Impulsivity  Risk Reduction Factors:   Employed and Positive coping skills or problem solving skills  Continued Clinical Symptoms:  Depression:   Impulsivity Dysthymia Personality Disorders:   Cluster B Chronic Pain Previous Psychiatric Diagnoses and Treatments Medical Diagnoses and Treatments/Surgeries  Cognitive Features That Contribute To Risk:  None    Suicide Risk:  Minimal: No identifiable suicidal ideation.  Patients presenting with no risk factors but with morbid ruminations; may be classified as minimal risk based on the severity of the depressive symptoms    Plan Of Care/Follow-up recommendations:  Activity:  ad lib  Sharma Covert, MD 05/27/2021, 12:56 PM

## 2021-05-27 NOTE — Progress Notes (Signed)
   05/27/21 1200  Psych Admission Type (Psych Patients Only)  Admission Status Voluntary  Psychosocial Assessment  Patient Complaints Depression;Anxiety  Eye Contact Fair  Facial Expression Pained;Pensive;Sullen;Sad  Affect Depressed;Sad  Speech Logical/coherent;Soft  Interaction Minimal;Guarded  Motor Activity Slow  Appearance/Hygiene Unremarkable  Behavior Characteristics Cooperative;Appropriate to situation  Mood Sad;Anxious  Aggressive Behavior  Effect No apparent injury  Thought Process  Coherency WDL  Content WDL  Delusions None reported or observed  Perception WDL  Hallucination None reported or observed  Judgment Poor  Confusion None  Danger to Self  Current suicidal ideation? Denies  Self-Injurious Behavior No self-injurious ideation or behavior indicators observed or expressed   Agreement Not to Harm Self Yes  Description of Agreement Verbal contract  Danger to Others  Danger to Others None reported or observed
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
## 2021-05-27 NOTE — Progress Notes (Signed)
  [facility] Adult Case Management Discharge Plan :  Will you be returning to the same living situation after discharge:  Yes,  apartment alone At discharge, do you have transportation home?: Yes,  car Do you have the ability to pay for your medications: Yes,  insurance  Release of information consent forms completed and emailed to Medical Records, then turned in to Medical Records by CSW.   Patient to Follow up at:  Follow-up Information     [facility]. Go on 05/31/2021.   Why: Next therapy appointment with Gaetano [facility] is on Wednesday 8/3 at 11 am VIA TELEHEALTH. Contact information: [place] [address] [phone]                 Next level of care provider has access to [place] and Suicide Prevention discussed: No.  Declined by patient     Has patient been referred to the Quitline?: N/A patient is not a smoker  Patient has been referred for addiction treatment: Yes  Maretta Los, LCSW 05/27/2021, 1:48 PM

## 2021-05-27 NOTE — BHH Suicide Risk Assessment (Signed)
[facility] INPATIENT:  Family/Significant Other Suicide Prevention Education  Suicide Prevention Education:  Patient Refusal for Family/Significant Other Suicide Prevention Education: The patient Nina Morrow has refused to provide written consent for family/significant other to be provided Family/Significant Other Suicide Prevention Education during admission and/or prior to discharge.  Physician notified.  Zayven Powe R 05/27/2021, 2:00 PM

## 2021-05-27 NOTE — Progress Notes (Signed)
   05/26/21 2124  Psych Admission Type (Psych Patients Only)  Admission Status Voluntary  Psychosocial Assessment  Patient Complaints Depression;Anxiety;Sadness  Eye Contact Fair  Facial Expression Pained;Pensive;Sullen;Sad  Affect Depressed;Sad  Speech Logical/coherent;Soft  Interaction Minimal;Guarded  Motor Activity Slow  Appearance/Hygiene Unremarkable  Behavior Characteristics Cooperative;Appropriate to situation  Mood Depressed;Anxious;Sad  Thought Process  Coherency WDL  Content WDL  Delusions None reported or observed  Perception WDL  Hallucination None reported or observed  Judgment Poor  Confusion None  Danger to Self  Current suicidal ideation? Passive  Self-Injurious Behavior No self-injurious ideation or behavior indicators observed or expressed   Agreement Not to Harm Self Yes  Description of Agreement Verbal contract  Danger to Others  Danger to Others None reported or observed

## 2021-05-30 ENCOUNTER — Encounter: Payer: Self-pay | Admitting: Physician Assistant

## 2021-05-30 ENCOUNTER — Inpatient Hospital Stay: Payer: 59 | Attending: Physician Assistant

## 2021-05-30 ENCOUNTER — Other Ambulatory Visit: Payer: Self-pay | Admitting: Physician Assistant

## 2021-05-30 ENCOUNTER — Other Ambulatory Visit: Payer: Self-pay

## 2021-05-30 VITALS — BP 134/87 | HR 71 | Temp 98.4°F | Resp 18

## 2021-05-30 DIAGNOSIS — D500 Iron deficiency anemia secondary to blood loss (chronic): Secondary | ICD-10-CM

## 2021-05-30 DIAGNOSIS — Z86718 Personal history of other venous thrombosis and embolism: Secondary | ICD-10-CM | POA: Diagnosis not present

## 2021-05-30 DIAGNOSIS — E538 Deficiency of other specified B group vitamins: Secondary | ICD-10-CM | POA: Diagnosis not present

## 2021-05-30 DIAGNOSIS — D509 Iron deficiency anemia, unspecified: Secondary | ICD-10-CM | POA: Insufficient documentation

## 2021-05-30 DIAGNOSIS — Z79899 Other long term (current) drug therapy: Secondary | ICD-10-CM | POA: Insufficient documentation

## 2021-05-30 DIAGNOSIS — Z86711 Personal history of pulmonary embolism: Secondary | ICD-10-CM | POA: Insufficient documentation

## 2021-05-30 MED ORDER — CYANOCOBALAMIN 1000 MCG/ML IJ SOLN
INTRAMUSCULAR | Status: AC
  Filled 2021-05-30: qty 1

## 2021-05-30 MED ORDER — CYANOCOBALAMIN 1000 MCG/ML IJ SOLN
1000.0000 ug | Freq: Once | INTRAMUSCULAR | Status: AC
  Administered 2021-05-30: 1000 ug via INTRAMUSCULAR

## 2021-05-30 MED ORDER — SODIUM CHLORIDE 0.9 % IV SOLN
200.0000 mg | Freq: Once | INTRAVENOUS | Status: AC
  Administered 2021-05-30: 200 mg via INTRAVENOUS
  Filled 2021-05-30: qty 200

## 2021-05-30 MED ORDER — SODIUM CHLORIDE 0.9 % IV SOLN
INTRAVENOUS | Status: DC
  Filled 2021-05-30: qty 250

## 2021-05-30 NOTE — Patient Instructions (Signed)
[facility]  Discharge Instructions: Thank you for choosing [facility] to provide your oncology and hematology care.   If you have a lab appointment with the [facility], please go directly to the [facility] and check in at the registration area.   Wear comfortable clothing and clothing appropriate for easy access to any Portacath or PICC line.   We strive to give you quality time with your provider. You may need to reschedule your appointment if you arrive late (15 or more minutes).  Arriving late affects you and other patients whose appointments are after yours.  Also, if you miss three or more appointments without notifying the office, you may be dismissed from the clinic at the provider's discretion.      For prescription refill requests, have your pharmacy contact our office and allow 72 hours for refills to be completed.    Today you received the following chemotherapy and/or immunotherapy agents : Venofer, B12 injection   To help prevent nausea and vomiting after your treatment, we encourage you to take your nausea medication as directed.  BELOW ARE SYMPTOMS THAT SHOULD BE REPORTED IMMEDIATELY: *FEVER GREATER THAN 100.4 F (38 C) OR HIGHER *CHILLS OR SWEATING *NAUSEA AND VOMITING THAT IS NOT CONTROLLED WITH YOUR NAUSEA MEDICATION *UNUSUAL SHORTNESS OF BREATH *UNUSUAL BRUISING OR BLEEDING *URINARY PROBLEMS (pain or burning when urinating, or frequent urination) *BOWEL PROBLEMS (unusual diarrhea, constipation, pain near the anus) TENDERNESS IN MOUTH AND THROAT WITH OR WITHOUT PRESENCE OF ULCERS (sore throat, sores in mouth, or a toothache) UNUSUAL RASH, SWELLING OR PAIN  UNUSUAL VAGINAL DISCHARGE OR ITCHING   Items with * indicate a potential emergency and should be followed up as soon as possible or go to the Emergency Department if any problems should occur.  Please show the CHEMOTHERAPY ALERT CARD or IMMUNOTHERAPY ALERT CARD at  check-in to the Emergency Department and triage nurse.  Should you have questions after your visit or need to cancel or reschedule your appointment, please contact [facility]  Dept: [phone]  and follow the prompts.  Office hours are 8:00 a.m. to 4:30 p.m. Monday - Friday. Please note that voicemails left after 4:00 p.m. may not be returned until the following business day.  We are closed weekends and major holidays. You have access to a nurse at all times for urgent questions. Please call the main number to the clinic Dept: [phone] and follow the prompts.   For any non-urgent questions, you may also contact your provider using MyChart. We now offer e-Visits for anyone 43 and older to request care online for non-urgent symptoms. For details visit [email or web address].   Also download the MyChart app! Go to the app store, search "MyChart", open the app, select [facility], and log in with your MyChart username and password.  Due to Covid, a mask is required upon entering the hospital/clinic. If you do not have a mask, one will be given to you upon arrival. For doctor visits, patients may have 1 support person aged 25 or older with them. For treatment visits, patients cannot have anyone with them due to current Covid guidelines and our immunocompromised population.

## 2021-06-06 ENCOUNTER — Telehealth: Payer: Self-pay

## 2021-06-06 ENCOUNTER — Inpatient Hospital Stay: Payer: 59

## 2021-06-06 NOTE — Telephone Encounter (Signed)
This RN attempted to reach patient regarding today's appointment at 3p. No answer, voicemail left.

## 2021-06-10 ENCOUNTER — Encounter: Payer: Self-pay | Admitting: Physician Assistant

## 2021-06-12 ENCOUNTER — Inpatient Hospital Stay: Payer: 59

## 2021-06-12 ENCOUNTER — Other Ambulatory Visit: Payer: Self-pay

## 2021-06-12 VITALS — BP 115/74 | HR 91 | Temp 98.2°F | Resp 20 | Wt 275.0 lb

## 2021-06-12 DIAGNOSIS — D500 Iron deficiency anemia secondary to blood loss (chronic): Secondary | ICD-10-CM

## 2021-06-12 DIAGNOSIS — Z86718 Personal history of other venous thrombosis and embolism: Secondary | ICD-10-CM | POA: Diagnosis not present

## 2021-06-12 MED ORDER — SODIUM CHLORIDE 0.9% FLUSH: 3.0000 mL | Freq: Once | INTRAVENOUS | Status: DC | PRN

## 2021-06-12 MED ORDER — SODIUM CHLORIDE 0.9 % IV SOLN: Freq: Once | INTRAVENOUS | Status: DC | PRN

## 2021-06-12 MED ORDER — HEPARIN SOD (PORK) LOCK FLUSH 100 UNIT/ML IV SOLN: 250.0000 [IU] | Freq: Once | INTRAVENOUS | Status: DC | PRN

## 2021-06-12 MED ORDER — SODIUM CHLORIDE 0.9% FLUSH
10.0000 mL | Freq: Once | INTRAVENOUS | Status: DC | PRN
Start: 2021-06-12 — End: 2021-06-12

## 2021-06-12 MED ORDER — ALTEPLASE 2 MG IJ SOLR: 2.0000 mg | Freq: Once | INTRAMUSCULAR | Status: DC | PRN

## 2021-06-12 MED ORDER — SODIUM CHLORIDE 0.9 % IV SOLN: Freq: Once | INTRAVENOUS | Status: AC

## 2021-06-12 MED ORDER — SODIUM CHLORIDE 0.9 % IV SOLN
200.0000 mg | Freq: Once | INTRAVENOUS | Status: AC
  Administered 2021-06-12: 200 mg via INTRAVENOUS
  Filled 2021-06-12: qty 200

## 2021-06-12 MED ORDER — DIPHENHYDRAMINE HCL 50 MG/ML IJ SOLN: 50.0000 mg | Freq: Once | INTRAMUSCULAR | Status: DC | PRN

## 2021-06-12 MED ORDER — METHYLPREDNISOLONE SODIUM SUCC 125 MG IJ SOLR: 125.0000 mg | Freq: Once | INTRAMUSCULAR | Status: DC | PRN

## 2021-06-12 MED ORDER — FAMOTIDINE 20 MG IN NS 100 ML IVPB: 20.0000 mg | Freq: Once | INTRAVENOUS | Status: DC | PRN

## 2021-06-12 MED ORDER — ALBUTEROL SULFATE (2.5 MG/3ML) 0.083% IN NEBU: 2.5000 mg | INHALATION_SOLUTION | Freq: Once | RESPIRATORY_TRACT | Status: DC | PRN

## 2021-06-12 MED ORDER — EPINEPHRINE 0.3 MG/0.3ML IJ SOAJ: 0.3000 mg | Freq: Once | INTRAMUSCULAR | Status: DC | PRN

## 2021-06-12 MED ORDER — HEPARIN SOD (PORK) LOCK FLUSH 100 UNIT/ML IV SOLN: 500.0000 [IU] | Freq: Once | INTRAVENOUS | Status: DC | PRN

## 2021-06-12 NOTE — Patient Instructions (Signed)

## 2021-06-12 NOTE — Progress Notes (Signed)
Pt. declines to stay for full 30 minute post observation. Vital signs stable and pt. denies chest pain, dizziness, and no shortness of breath noted. Left via ambulation.

## 2021-06-20 ENCOUNTER — Encounter: Payer: Self-pay | Admitting: Physician Assistant

## 2021-06-21 ENCOUNTER — Encounter: Payer: Self-pay | Admitting: *Deleted

## 2021-06-23 NOTE — Progress Notes (Signed)
Surgical Instructions    Your procedure is scheduled on Tuesday, September 13th, 2022.   Report to [facility] Main Entrance "A" at 05:30 A.M., then check in with the Admitting office.  Call this number if you have problems the morning of surgery:  [phone]   If you have any questions prior to your surgery date call [phone]: Open Monday-Friday 8am-4pm    Remember:  Do not eat after midnight the night before your surgery  You may drink clear liquids until       the morning of your surgery.   Clear liquids allowed are: Water, Non-Citrus Juices (without pulp), Carbonated Beverages, Clear Tea, Black Coffee ONLY (NO MILK, CREAM OR POWDERED CREAMER of any kind), and Gatorade    Take these medicines the morning of surgery with A SIP OF WATER:  VRAYLAR  albuterol (VENTOLIN HFA) - please, bring the inhaler with you the day of surgery  Follow your surgeon's instructions on when to stop Xarelto.  If no instructions were given by your surgeon then you will need to call the office to get those instructions.     As of today, STOP taking any Aspirin (unless otherwise instructed by your surgeon) Aleve, Naproxen, Ibuprofen, Motrin, Advil, Goody's, BC's, all herbal medications, fish oil, and all vitamins.          Do not wear jewelry or makeup Do not wear lotions, powders, perfumes, or deodorant. Do not shave 48 hours prior to surgery.   Do not bring valuables to the hospital. DO Not wear nail polish, gel polish, artificial nails, or any other type of covering on natural nails including finger and toenails. If patients have artificial nails, gel coating, etc. that need to be removed by a nail salon please have this removed prior to surgery or surgery may need to be canceled/delayed if the surgeon/ anesthesia feels like the patient is unable to be adequately monitored.             [facility] is not responsible for any belongings or valuables.  Do NOT Smoke (Tobacco/Vaping) or drink Alcohol  24 hours prior to your procedure If you use a CPAP at night, you may bring all equipment for your overnight stay.   Contacts, glasses, dentures or bridgework may not be worn into surgery, please bring cases for these belongings   For patients admitted to the hospital, discharge time will be determined by your treatment team.   Patients discharged the day of surgery will not be allowed to drive home, and someone needs to stay with them for 24 hours.  ONLY 1 SUPPORT PERSON MAY BE PRESENT WHILE YOU ARE IN SURGERY. IF YOU ARE TO BE ADMITTED ONCE YOU ARE IN YOUR ROOM YOU WILL BE ALLOWED TWO (2) VISITORS.  Minor children may have two parents present. Special consideration for safety and communication needs will be reviewed on a case by case basis.  Special instructions:    Oral Hygiene is also important to reduce your risk of infection.  Remember - BRUSH YOUR TEETH THE MORNING OF SURGERY WITH YOUR REGULAR TOOTHPASTE   [place]- Preparing For Surgery  Before surgery, you can play an important role. Because skin is not sterile, your skin needs to be as free of germs as possible. You can reduce the number of germs on your skin by washing with CHG (chlorahexidine gluconate) Soap before surgery.  CHG is an antiseptic cleaner which kills germs and bonds with the skin to continue killing germs even after  washing.     Please do not use if you have an allergy to CHG or antibacterial soaps. If your skin becomes reddened/irritated stop using the CHG.  Do not shave (including legs and underarms) for at least 48 hours prior to first CHG shower. It is OK to shave your face.  Please follow these instructions carefully.     Shower the NIGHT BEFORE SURGERY and the MORNING OF SURGERY with CHG Soap.   If you chose to wash your hair, wash your hair first as usual with your normal shampoo. After you shampoo, rinse your hair and body thoroughly to remove the shampoo.  Then [employer] and genitals (private parts)  with your normal soap and rinse thoroughly to remove soap.  After that Use CHG Soap as you would any other liquid soap. You can apply CHG directly to the skin and wash gently with a scrungie or a clean washcloth.   Apply the CHG Soap to your body ONLY FROM THE NECK DOWN.  Do not use on open wounds or open sores. Avoid contact with your eyes, ears, mouth and genitals (private parts). Wash Face and genitals (private parts)  with your normal soap.   Wash thoroughly, paying special attention to the area where your surgery will be performed.  Thoroughly rinse your body with warm water from the neck down.  DO NOT shower/wash with your normal soap after using and rinsing off the CHG Soap.  Pat yourself dry with a CLEAN TOWEL.  Wear CLEAN PAJAMAS to bed the night before surgery  Place CLEAN SHEETS on your bed the night before your surgery  DO NOT SLEEP WITH PETS.   Day of Surgery:  Take a shower with CHG soap. Wear Clean/Comfortable clothing the morning of surgery Do not apply any deodorants/lotions.   Remember to brush your teeth WITH YOUR REGULAR TOOTHPASTE.   Please read over the following fact sheets that you were given.

## 2021-06-23 NOTE — H&P (Signed)
Nina Morrow is an 30 y.o. 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  In regards to her abnormal uterine bleeding, she reports that her periods are regular, last 8-9 days with heaviest bleeding occurring x 2-3 days. She has previously tried Nuvaring, OCPs, and Nexplanon in the past. Has not been on any estrogen-containing contraceptives due to hx VTE. She continued to have persistent bleeding with Mirena IUD in place. As documented below, patient has a TVUS 01/03/21 to evaluate her AUB and it was noted that the IUD was within the lower uterine segment/cervix. Mirena IUD was removed/replaced on 01/09/2021. IUD strings were visualized at the time of her colposcopy visit on 01/25/2021 for abnormal pap smear (see below). Patient was re-evaluated on 02/15/21 due to [place] IUD having expelled on 02/12/21 associated with heavy bleeding. At the time of her evaluation, she had re-started Xarelto for history of VTE. She was started on Norethindrone to aid with bleeding.  Patient was evaluated via colposcopy for ASCUS/HRHPV positive pap smear on 12/27/2020. Cervical biopsies at 12 o'clock and 5 o'clock with ECC were collected. Cervical biopsies at both sites revealed CIN II-III. ECC was benign. Patient was originally scheduled for CKC 03/22/21; however, she endorsed symptoms of SOB and chest pain at her hospital pre-operative assessment and her surgery was cancelled. She was recommended by anesthesia to wear a holter monitor for at least 1 month prior to her procedure. Patient [facility] with [facility]. 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 She also reports episodes of hypotension and syncope. Ultimately, patient has been  cleared by Cardiology to proceed with surgery.  Patient has also mentioned RLQ pelvic pain and dyspareunia that has worsened since 2020. Ultrasounds have been unremarkable and no source of pain has been identified. Initially, she was planned to undergo diagnostic laparoscopy as well; however, given patient's ultimate desire for definitive surgical management via hysterectomy, she opted to forego this procedure.  Patient has endorsed that she very strongly does not desire future fertility. She desires definitive surgical management of her bleeding. She understands that this will result in sterility. She understands that definitive surgical management is not yet recommended until diagnostic excisional procedure and endometrial sampling has been performed to rule out carcinoma.  Patient has undergone GI work-up about 10 years ago. Reports having had a previously normal colonoscopy and EGD revealed ulcers.   Cardiology Clearance/Recommendations: "From cardiac standpoint, I would avoid BP and HR fluctuations during surgery. I would keep K+ 4.0-5.0 range and Magnesium 2.0-2.3 range" (Dr. Marina Goodell with [facility])  Hematology/Oncology Clearance/Recommendations:  Hold Xarelto 48 hours prior to surgery and resume once hemostasis is achieved Nina Query, PA-C with [facility])  Current anticoagulation: Xarelto '20mg'$  qD  TVUS (01/03/2021): Uterus 8.79 x 4.04 x 4.87cm Endometrial thickness 1.03cm R ovary 3.41cm  L ovary 2.69cm Anteverted uterus. No uterine anomalies seen. Endometrium thickened - no blood flow noted. IUD seen within lower uterine segment/cervix. R ovary - simple follicle 1.5 x 1.4 x 0000000, avascular. Left ovary wnl. No adnexal masses seen. Free fluid seen in cul-de-sac.  TVUS (04/17/21): Uterus 8.40 x 3.75 x 4.80cm Endometrial thickness 0.78cm R ovary 3.34cm  L ovary 3.25cm Anteverted uterus. No uterine anomalies seen. Endometrium thickened and trilayered - no  discrete masses seen, no blood flow noted. Bilateral ovaries wnl. No adnexal masses seen.  Patient Active Problem List   Diagnosis Date Noted   Bipolar 2 disorder, major depressive episode ([place]) 05/26/2021   Vitamin B12 deficiency 05/12/2021   Iron deficiency anemia 02/03/2021   History of DVT (deep vein thrombosis) 02/03/2021   Personal history of venous thrombosis and embolism 02/03/2021    MEDICAL/FAMILY/SOCIAL HX: No LMP recorded.    Past Medical History:  Diagnosis Date   Bipolar 1 disorder ([place])    with depression and anxiety. stopped taking lithium december 2021.    DVT (deep venous thrombosis) (HCC)    Hallucination    Palpitation    PCOS (polycystic ovarian syndrome)    Pulmonary emboli (HCC)    Syncope and collapse     Past Surgical History:  Procedure Laterality Date   BARIATRIC SURGERY     TONSILLECTOMY      Family History  Problem Relation Age of Onset   Transient ischemic attack Mother        55   Pulmonary embolism Sister        May 2021   Breast cancer Maternal Grandmother    Stomach cancer Maternal Grandfather    Lung cancer Maternal Grandfather    Breast cancer Cousin     Social History:  reports that she quit smoking about 9 years ago. Her smoking use included e-cigarettes, cigarettes, and cigars. She has never used smokeless tobacco. She reports current alcohol use. She reports current drug use. Drug: Marijuana.  ALLERGIES/MEDS:  Allergies:  Allergies  Allergen Reactions   Aripiprazole Other (See Comments)    Light headed, SOB, diarrhea, vomiting   Grass Extracts [Gramineae Pollens] Other (See Comments)    Coughing, sneezing   Latex Hives and Rash    No medications prior to admission.     Review of Systems  Constitutional: Negative.   HENT: Negative.    Eyes: Negative.   Respiratory: Negative.    Cardiovascular: Negative.   Gastrointestinal: Negative.   Genitourinary: Negative.   Musculoskeletal: Negative.   Skin: Negative.    Neurological: Negative.   Endo/Heme/Allergies: Negative.   Psychiatric/Behavioral: Negative.     There were no vitals taken for this visit. Gen:  NAD, pleasant and cooperative Cardio:  RRR Pulm:  CTAB, no wheezes/rales/rhonchi Abd:  Soft, non-distended, non-tender throughout, no rebound/guarding Ext:  No bilateral LE edema, no bilateral calf tenderness Pelvic: Laba- unremarkable, vagina - pink moist mucosa, no lesions or abnormal discharge, cervix - no discharge or lesions or CMT, adnexa - no masses or tenderness, uterus - nontender and normal size on palpation  No results found for this or any previous visit (from the past 24 hour(s)).  No results found.   ASSESSMENT/PLAN: Nina Morrow is a 30 y.o. 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  - Admit to [facility] labs (CBC, T&S, COVID screen, any others per anesthesia) - Diet:  Per anesthesia - IVF:  Per anesthesia - VTE Prophylaxis:  SCDs - Anticoagulation: HELD x 24h prior to procedure - Antibiotics: None - D/C home same day  Consents: I discussed with the patient that this surgery is performed to look inside the uterus and remove the uterine lining.  Prior to surgery, the risks and benefits of the  surgery, as well as alternative treatments, have been discussed.  The risks include, but are not limited to bleeding, including the need for a blood transfusion, infection, damage to organs and tissues, including uterine perforation, requiring additional surgery, postoperative pain, short-term and long-term, failure of the procedure to control symptoms, need for hysterectomy to control bleeding, fluid overload, which could create electrolyte abnormalities and the need to stop the procedure before completion,  inability to safely complete the procedure, deep vein thrombosis and/or pulmonary embolism, painful intercourse, complications the course of which cannot be predicted or prevented, and death.  The nature of the CKC is to surgically destroy or remove the abnormal areas, and to send a piece of the tissue to the lab for analysis.  The procedure is diagnostic, and usually therapeutic.  This procedure holds risks of infection, bleeding requiring a blood transfusion, incomplete resolution of the abnormality requiring a future procedure or additional follow-up testing, inability to carry a pregnancy (cervical insufficiency), cervical stenosis (narrowing), blood clot (DVT or PE), and ultimately a risk of death.  Patient understands these risks and agrees to proceed with the above named procedure. Patient was consented for blood products.  The patient is aware that bleeding may result in the need for a blood transfusion which includes risk of transmission of HIV (1:2 million), Hepatitis C (1:2 million), and Hepatitis B (1:200 thousand) and transfusion reaction.  Patient voiced understanding of the above risks as well as understanding of indications for blood transfusion.   Drema Dallas, DO [phone] (office)

## 2021-06-26 ENCOUNTER — Encounter (HOSPITAL_COMMUNITY)
Admission: RE | Admit: 2021-06-26 | Discharge: 2021-06-26 | Disposition: A | Discharge status: Home / Self Care | Payer: 59 | Source: Ambulatory Visit | Attending: Obstetrics and Gynecology | Admitting: Obstetrics and Gynecology

## 2021-06-26 ENCOUNTER — Other Ambulatory Visit: Payer: Self-pay

## 2021-06-26 ENCOUNTER — Encounter (HOSPITAL_COMMUNITY): Payer: Self-pay

## 2021-06-26 DIAGNOSIS — N879 Dysplasia of cervix uteri, unspecified: Secondary | ICD-10-CM | POA: Diagnosis not present

## 2021-06-26 DIAGNOSIS — Z01812 Encounter for preprocedural laboratory examination: Secondary | ICD-10-CM | POA: Insufficient documentation

## 2021-06-26 DIAGNOSIS — G4733 Obstructive sleep apnea (adult) (pediatric): Secondary | ICD-10-CM | POA: Diagnosis not present

## 2021-06-26 DIAGNOSIS — Z86711 Personal history of pulmonary embolism: Secondary | ICD-10-CM | POA: Diagnosis not present

## 2021-06-26 DIAGNOSIS — Z86718 Personal history of other venous thrombosis and embolism: Secondary | ICD-10-CM | POA: Diagnosis not present

## 2021-06-26 DIAGNOSIS — Z87891 Personal history of nicotine dependence: Secondary | ICD-10-CM | POA: Insufficient documentation

## 2021-06-26 HISTORY — DX: Gastro-esophageal reflux disease without esophagitis: K21.9

## 2021-06-26 HISTORY — DX: Pneumonia, unspecified organism: J18.9

## 2021-06-26 HISTORY — DX: Anemia, unspecified: D64.9

## 2021-06-26 HISTORY — DX: Headache, unspecified: R51.9

## 2021-06-26 HISTORY — DX: Anxiety disorder, unspecified: F41.9

## 2021-06-26 HISTORY — DX: Depression, unspecified: F32.A

## 2021-06-26 HISTORY — DX: Malignant (primary) neoplasm, unspecified: C80.1

## 2021-06-26 LAB — CBC
HCT: 39.2 % (ref 36.0–46.0)
Hemoglobin: 12.2 g/dL (ref 12.0–15.0)
MCH: 25.9 pg — ABNORMAL LOW (ref 26.0–34.0)
MCHC: 31.1 g/dL (ref 30.0–36.0)
MCV: 83.2 fL (ref 80.0–100.0)
Platelets: 291 10*3/uL (ref 150–400)
RBC: 4.71 MIL/uL (ref 3.87–5.11)
RDW: 18.6 % — ABNORMAL HIGH (ref 11.5–15.5)
WBC: 5 10*3/uL (ref 4.0–10.5)
nRBC: 0 % (ref 0.0–0.2)

## 2021-06-26 LAB — COMPREHENSIVE METABOLIC PANEL
ALT: 18 U/L (ref 0–44)
AST: 17 U/L (ref 15–41)
Albumin: 3.3 g/dL — ABNORMAL LOW (ref 3.5–5.0)
Alkaline Phosphatase: 61 U/L (ref 38–126)
Anion gap: 6 (ref 5–15)
BUN: 9 mg/dL (ref 6–20)
CO2: 27 mmol/L (ref 22–32)
Calcium: 8.4 mg/dL — ABNORMAL LOW (ref 8.9–10.3)
Chloride: 108 mmol/L (ref 98–111)
Creatinine, Ser: 0.83 mg/dL (ref 0.44–1.00)
GFR, Estimated: >60 mL/min (ref >60)
Glucose, Bld: 89 mg/dL (ref 70–99)
Potassium: 3.9 mmol/L (ref 3.5–5.1)
Sodium: 141 mmol/L (ref 135–145)
Total Bilirubin: 0.2 mg/dL — ABNORMAL LOW (ref 0.3–1.2)
Total Protein: 6.7 g/dL (ref 6.5–8.1)

## 2021-06-26 NOTE — Progress Notes (Signed)
Surgical Instructions    Your procedure is scheduled on Tuesday, September 13th, 2022.   Report to [facility] Main Entrance "A" at 05:30 A.M., then check in with the Admitting office.  Call this number if you have problems the morning of surgery:  [phone]   If you have any questions prior to your surgery date call [phone]: Open Monday-Friday 8am-4pm    Remember:  Do not eat after midnight the night before your surgery  You may drink clear liquids until 4:30 AM the morning of your surgery.   Clear liquids allowed are: Water, Non-Citrus Juices (without pulp), Carbonated Beverages, Clear Tea, Black Coffee ONLY (NO MILK, CREAM OR POWDERED CREAMER of any kind), and Gatorade    Take these medicines the morning of surgery with A SIP OF WATER:  VRAYLAR  albuterol (VENTOLIN HFA) - please, bring the inhaler with you the day of surgery  Follow your surgeon's instructions on when to stop Xarelto.  If no instructions were given by your surgeon then you will need to call the office to get those instructions.     As of today, STOP taking any Aspirin (unless otherwise instructed by your surgeon) Aleve, Naproxen, Ibuprofen, Motrin, Advil, Goody's, BC's, all herbal medications, fish oil, and all vitamins.          Do not wear jewelry, makeup, or nail polish Do not wear lotions, powders, perfumes, or deodorant. Do not shave 48 hours prior to surgery.   Do not bring valuables to the hospital.             [facility] is not responsible for any belongings or valuables.  Do NOT Smoke (Tobacco/Vaping) or drink Alcohol 24 hours prior to your procedure If you use a CPAP at night, you may bring all equipment for your overnight stay.   Contacts, glasses, dentures or bridgework may not be worn into surgery, please bring cases for these belongings   For patients admitted to the hospital, discharge time will be determined by your treatment team.   Patients discharged the day of surgery will not  be allowed to drive home, and someone needs to stay with them for 24 hours.  ONLY 1 SUPPORT PERSON MAY BE PRESENT WHILE YOU ARE IN SURGERY. IF YOU ARE TO BE ADMITTED ONCE YOU ARE IN YOUR ROOM YOU WILL BE ALLOWED TWO (2) VISITORS.  Minor children may have two parents present. Special consideration for safety and communication needs will be reviewed on a case by case basis.  Special instructions:    Oral Hygiene is also important to reduce your risk of infection.  Remember - BRUSH YOUR TEETH THE MORNING OF SURGERY WITH YOUR REGULAR TOOTHPASTE   [place]- Preparing For Surgery  Before surgery, you can play an important role. Because skin is not sterile, your skin needs to be as free of germs as possible. You can reduce the number of germs on your skin by washing with CHG (chlorahexidine gluconate) Soap before surgery.  CHG is an antiseptic cleaner which kills germs and bonds with the skin to continue killing germs even after washing.     Please do not use if you have an allergy to CHG or antibacterial soaps. If your skin becomes reddened/irritated stop using the CHG.  Do not shave (including legs and underarms) for at least 48 hours prior to first CHG shower. It is OK to shave your face.  Please follow these instructions carefully.     Shower the [employer] SURGERY and  the MORNING OF SURGERY with CHG Soap.   If you chose to wash your hair, wash your hair first as usual with your normal shampoo. After you shampoo, rinse your hair and body thoroughly to remove the shampoo.  Then [employer] and genitals (private parts) with your normal soap and rinse thoroughly to remove soap.  After that Use CHG Soap as you would any other liquid soap. You can apply CHG directly to the skin and wash gently with a scrungie or a clean washcloth.   Apply the CHG Soap to your body ONLY FROM THE NECK DOWN.  Do not use on open wounds or open sores. Avoid contact with your eyes, ears, mouth and genitals (private  parts). Wash Face and genitals (private parts)  with your normal soap.   Wash thoroughly, paying special attention to the area where your surgery will be performed.  Thoroughly rinse your body with warm water from the neck down.  DO NOT shower/wash with your normal soap after using and rinsing off the CHG Soap.  Pat yourself dry with a CLEAN TOWEL.  Wear CLEAN PAJAMAS to bed the night before surgery  Place CLEAN SHEETS on your bed the night before your surgery  DO NOT SLEEP WITH PETS.   Day of Surgery:  Take a shower with CHG soap. Wear Clean/Comfortable clothing the morning of surgery Do not apply any deodorants/lotions.   Remember to brush your teeth WITH YOUR REGULAR TOOTHPASTE.   Please read over the following fact sheets that you were given.

## 2021-06-26 NOTE — Progress Notes (Signed)
PCP - Raelyn Number Cardiologist - Marina Goodell [facility])  Chest x-ray - 11/22/20 EKG - 11/25/20 ECHO - 04/06/21  SA - yes, does not wear CPAP  Blood Thinner Instructions: Instructed to stop Xarelto 2 days prior to surgery, per Dr. Delora Fuel   ERAS Protcol - yes, no drink ordered or given   COVID TEST- n/a (ambulatory surgery)   Anesthesia review: yes, recent heart workup ([facility]) - for palpitations & syncope  Patient denies shortness of breath, fever, cough and chest pain at PAT appointment   All instructions explained to the patient, with a verbal understanding of the material. Patient agrees to go over the instructions while at home for a better understanding. Patient also instructed to self quarantine after being tested for COVID-19. The opportunity to ask questions was provided.

## 2021-06-27 NOTE — Progress Notes (Addendum)
Anesthesia Chart Review:  Case: [id number] Date/Time: 07/11/21 0715   Procedures:      DILATATION AND CURETTAGE /HYSTEROSCOPY     CONIZATION CERVIX WITH BIOPSY   Anesthesia type: Choice   Pre-op diagnosis:      Pelvic Pain     Severe Dysplasia of Cervix   Location: [facility] OR ROOM 08 / [facility]   Surgeons: Drema Dallas, DO       DISCUSSION: Patient is a 30 year old female scheduled for the above procedure.  History includes former smoker (quit 02/04/12), DVT/PE (recurrent, despite stopping OCPs), OSA (does not wear CPAP), Bipolar 1 disorder, hallucinations, PCOS, palpitations, syncope, GERD, anemia (s/p IV Venofer, last 06/12/21), cervical cancer (severe dysplasia), bariatric surgery (~ 09/2019), T&A. + Marijuana 05/26/21. Last [facility] admission in late July 2022.  DVT/PE as outlined by [name], PA-C: 06/2010: Right leg DVT and PE treated with warfarin x 3 months-felt to be provoked by OCPs 2015: Left Leg DVT. Felt to be provoked by OCPs. Recommended indefinite AC and treated with warfarin. Patient self-discontinued after 6 months 12/2017: Right leg DVT and PE treated with warfarin-no provoking factor since not on OCPS.  03/14/2020: Checked Factor V Leiden mutation, Prothrombin Mutation, Lupus anticoagulant-Not present 03/30/2020: Switch to Eliquis.  10/2020: Discontinued Eliquis due to cost.  02/02/2021: Establish care with [name] PA-C. Recommend indefinite anticoagulation with Xarelto. Remaining hypercoagulable workup was done here on 02/03/2021 checking protein C and protein S activity, beta-2 glycoprotein antibody and cardiolipin antibody. Findings were unremarkable except for mild Protein S activity.   She reported instructions from Dr. Delora Fuel to hold Xarelto for 2 days prior to surgery.   Recent neurology and cardiac testing to evaluation for syncope. Per 02/16/21 [facility] neurology note by Dr. 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 Will get w/u  for possible seizure. If negative may benefit from cardiology evaluation. Recommend she not drive until spells controlled. 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 Gretchen Portela who ordered echo and 30 day event monitor which were also reassuring. Orthostatics were negative. She was also referred back to Sleep Medicine since she was intolerant to CPAP.   Preoperative labs show H/H stable over the past month at 12.2/39.2. T&S ordered by surgeon, but PAT > 14 days for surgery so will be done on the day of surgery. She is also for a urine pregnancy test on arrival. (UPDATE 06/29/21 9:19 AM: Received note of cardiac clearance from Dr. Geanie Berlin with recommendation to avoid BP and HR fluctuations during surgery and keep "K 4.0-5.__" and "Mg 2.0-__". K on 06/26/21 was normal at 3.9. Mg not routinely checked preoperatively, so would defer to surgeon and/or anesthesiologist decision to check any additional labs during the perioperative period. She does not have a history of hypomagnesemia with normal level 01/18/20 and 10/03/19.)   VS: BP (!) 145/62   Pulse 85   Temp 37.1 C (Oral)   Resp 19   Ht 6' (1.829 m)   Wt 128.2 kg   SpO2 100%   BMI 38.34 kg/m    PROVIDERS: Trey Sailors, PA is PCP Marina Goodell, MD is cardiologist [facility]) Chales Salmon, MD is neurologist [facility]) - She had new patient sleep consult on 06/13/21 with Nani Skillern, [place] at [facility] who plans to obtain records from her prior sleep study to determine best next steps in treated her OSA.  LABS: Labs reviewed: Acceptable for surgery. (all labs ordered are listed, but only abnormal results are displayed)  Labs Reviewed  CBC - Abnormal; Notable for the following components:      Result Value   MCH 25.9 (*)    RDW 18.6 (*)    All other components within normal  limits  COMPREHENSIVE METABOLIC PANEL - Abnormal; Notable for the following components:   Calcium 8.4 (*)    Albumin 3.3 (*)    Total Bilirubin 0.2 (*)    All other components within normal limits  A1c 5.6% on 01/20/21 (Care Everywhere, [place]).  OTHER: EEG (awake & drowsy) 02/21/21 ([facility]): INTERPRETATION:    -No clear and precise evidence was seen for any focal or diffuse abnormality.    -Drowsiness and sleep were  achieved.  -No clear epileptiform activity was noted and therefore no definite evidence was noted for a seizure disorder.       IMAGES: MRI Head 04/07/21 ([facility]): IMPRESSION:  Normal MRI of the head.    CXR 11/22/20: FINDINGS: The heart size and mediastinal contours are within normal limits. Both lungs are clear. The visualized skeletal structures are unremarkable. IMPRESSION: No active cardiopulmonary disease.     EKG: EKG 03/15/21 ([facility]): Per Result Narrative. Sinus  Bradycardia, HR 58  Slight early regularization changes.  WITHIN NORMAL LIMITS  EKG 11/22/20: Sinus bradycardia with Premature atrial complexes in a pattern of bigeminy Otherwise normal ECG Confirmed by Madalyn Rob [phone]) on 11/22/2020 10:54:04 PM   CV: Echo 04/06/21 ([facility]): Impression: Left Ventricle: Systolic function is normal. EF: 60%.    Left Ventricle: There is borderline hypertrophy.    Left Atrium: Left atrium is moderately dilated.    Left Ventricle: Wall motion is normal.    Left Ventricle: Doppler parameters indicate normal diastolic function.    Left Atrium: The interatrial septal aneurysm noted.  Intra-arterial  septum is mobile and bows toward the right.    Tricuspid Valve: The right ventricular systolic pressure is normal (<36  mmHg).  - No significant valvular disease noted.   30 day Cardiac Event Monitor 03/20/21-04/18/21 ([facility]): Indication: Palpitations.  Syncope.     Patient remained in sinus rhythm with average heart rate 80.    Minimum HR 46 bpm at 6:08 AM on May 31.  Maximum HR 184 bpm at 12:02 PM on June 2.  It was sinus tachycardia.   Patient remained bradycardic 5 % of total time.  The longest episode of bradycardia lasted 37 minutes and 26 seconds at 3 AM on June 8.  Average HR during bradycardia 55 beats per minute.   Patient remained tachycardic 12 % of total time.  The longest episode of tachycardia lasted 1 hour 39 minutes and 23 seconds at 12:22 AM on June 15.  Average HR during tachycardia 121 beats per minute.   Few PVCs noted representing  < 1% total beats.   1 run of 4 beat wide-complex tachycardia noted with heart rate about 120 bpm.  The rhythm was very irregular and likely represented quadruplet of PVCs.  Patient remained asymptomatic during episode.   Few supraventricular ectopic beats noted representing < 1% total beats.   4 patient triggered episodes were recorded.  During these episodes patient felt palpitations, dizziness, shortness of breath, CP and one episode of syncope.  The strips during those episodes revealed sinus rhythm with heart rate 56 - 113 with no arrhythmias noted.  During episode of syncope, palpitations, chest pain, fatigue at  11:36 AM on June 8 sinus rhythm with heart rate 99 bpm noted with no arrhythmias.   The longest R to R interval 1.37 seconds at 6:08 AM on May 31.   No SVT, no A. fib, no long pauses 3 seconds or more noted.  Past Medical History:  Diagnosis Date   Anemia    Anxiety    Bipolar 1 disorder ([place])    with depression and anxiety. stopped taking lithium december 2021.    Cancer ([place])    Cervical   Depression    DVT (deep venous thrombosis) ([facility])    GERD (gastroesophageal reflux disease)    Hallucination    Headache    Palpitation    PCOS (polycystic ovarian syndrome)    Pneumonia    Pulmonary emboli ([facility])    Syncope and collapse     Past Surgical History:  Procedure Laterality Date   BARIATRIC SURGERY     TONSILLECTOMY     TONSILLECTOMY  AND ADENOIDECTOMY      MEDICATIONS:  albuterol (VENTOLIN HFA) 108 (90 Base) MCG/ACT inhaler   conjugated estrogens (PREMARIN) vaginal cream   doxepin (SINEQUAN) 10 MG capsule   lurasidone (LATUDA) 20 MG TABS tablet   Magnesium Oxide (MAG-OXIDE) 200 MG TABS   norethindrone (MICRONOR) 0.35 MG tablet   rivaroxaban (XARELTO) 20 MG TABS tablet   VRAYLAR 1.5 MG capsule   No current facility-administered medications for this encounter.    Myra Gianotti, PA-C Surgical Short Stay/Anesthesiology [facility] Phone [phone] [facility] Phone [phone] 06/27/2021 6:50 PM

## 2021-06-29 ENCOUNTER — Encounter: Payer: Self-pay | Admitting: Physician Assistant

## 2021-06-29 NOTE — Anesthesia Preprocedure Evaluation (Addendum)
Anesthesia Evaluation  Patient identified by MRN, date of birth, ID band Patient awake    Reviewed: Allergy & Precautions, NPO status , Patient's Chart, lab work & pertinent test results  Airway Mallampati: II  TM Distance: >3 FB Neck ROM: Full    Dental no notable dental hx.    Pulmonary neg pulmonary ROS, former smoker,    Pulmonary exam normal breath sounds clear to auscultation       Cardiovascular negative cardio ROS Normal cardiovascular exam Rhythm:Regular Rate:Normal     Neuro/Psych Bipolar Disorder negative neurological ROS     GI/Hepatic Neg liver ROS, GERD  ,  Endo/Other  PCOS  Renal/GU negative Renal ROS  negative genitourinary   Musculoskeletal negative musculoskeletal ROS (+)   Abdominal   Peds negative pediatric ROS (+)  Hematology negative hematology ROS (+)   Anesthesia Other Findings   Reproductive/Obstetrics negative OB ROS                            Anesthesia Physical Anesthesia Plan  ASA: 2  Anesthesia Plan: General   Post-op Pain Management:    Induction: Intravenous  PONV Risk Score and Plan: 3 and Ondansetron, Dexamethasone, Droperidol, Treatment may vary due to age or medical condition and Midazolam  Airway Management Planned: Oral ETT  Additional Equipment:   Intra-op Plan:   Post-operative Plan: Extubation in OR  Informed Consent: I have reviewed the patients History and Physical, chart, labs and discussed the procedure including the risks, benefits and alternatives for the proposed anesthesia with the patient or authorized representative who has indicated his/her understanding and acceptance.     Dental advisory given  Plan Discussed with: CRNA and Surgeon  Anesthesia Plan Comments: (PAT note written by Myra Gianotti, PA-C. )       Anesthesia Quick Evaluation

## 2021-06-30 ENCOUNTER — Encounter: Payer: Self-pay | Admitting: Physician Assistant

## 2021-07-11 ENCOUNTER — Ambulatory Visit: Payer: 59

## 2021-07-11 ENCOUNTER — Ambulatory Visit (HOSPITAL_COMMUNITY)
Admission: RE | Admit: 2021-07-11 | Discharge: 2021-07-11 | Disposition: A | Discharge status: Home / Self Care | Payer: 59 | Source: Ambulatory Visit | Attending: Obstetrics and Gynecology | Admitting: Obstetrics and Gynecology

## 2021-07-11 ENCOUNTER — Ambulatory Visit (HOSPITAL_COMMUNITY): Payer: 59 | Admitting: Certified Registered"

## 2021-07-11 ENCOUNTER — Encounter (HOSPITAL_COMMUNITY): Payer: Self-pay | Admitting: Obstetrics and Gynecology

## 2021-07-11 ENCOUNTER — Other Ambulatory Visit: Payer: Self-pay

## 2021-07-11 ENCOUNTER — Ambulatory Visit (HOSPITAL_COMMUNITY): Payer: 59 | Admitting: Vascular Surgery

## 2021-07-11 ENCOUNTER — Encounter (HOSPITAL_COMMUNITY)
Admission: RE | Disposition: A | Discharge status: Home / Self Care | Payer: Self-pay | Source: Ambulatory Visit | Attending: Obstetrics and Gynecology

## 2021-07-11 DIAGNOSIS — Z7901 Long term (current) use of anticoagulants: Secondary | ICD-10-CM | POA: Insufficient documentation

## 2021-07-11 DIAGNOSIS — Z91040 Latex allergy status: Secondary | ICD-10-CM | POA: Diagnosis not present

## 2021-07-11 DIAGNOSIS — Z800 Family history of malignant neoplasm of digestive organs: Secondary | ICD-10-CM | POA: Insufficient documentation

## 2021-07-11 DIAGNOSIS — Z823 Family history of stroke: Secondary | ICD-10-CM | POA: Diagnosis not present

## 2021-07-11 DIAGNOSIS — Z86711 Personal history of pulmonary embolism: Secondary | ICD-10-CM | POA: Diagnosis not present

## 2021-07-11 DIAGNOSIS — Z801 Family history of malignant neoplasm of trachea, bronchus and lung: Secondary | ICD-10-CM | POA: Diagnosis not present

## 2021-07-11 DIAGNOSIS — Z803 Family history of malignant neoplasm of breast: Secondary | ICD-10-CM | POA: Diagnosis not present

## 2021-07-11 DIAGNOSIS — N840 Polyp of corpus uteri: Secondary | ICD-10-CM | POA: Diagnosis not present

## 2021-07-11 DIAGNOSIS — N871 Moderate cervical dysplasia: Secondary | ICD-10-CM | POA: Diagnosis not present

## 2021-07-11 DIAGNOSIS — N939 Abnormal uterine and vaginal bleeding, unspecified: Secondary | ICD-10-CM | POA: Insufficient documentation

## 2021-07-11 DIAGNOSIS — Z9109 Other allergy status, other than to drugs and biological substances: Secondary | ICD-10-CM | POA: Diagnosis not present

## 2021-07-11 DIAGNOSIS — Z87891 Personal history of nicotine dependence: Secondary | ICD-10-CM | POA: Insufficient documentation

## 2021-07-11 DIAGNOSIS — Z8249 Family history of ischemic heart disease and other diseases of the circulatory system: Secondary | ICD-10-CM | POA: Insufficient documentation

## 2021-07-11 DIAGNOSIS — Z86718 Personal history of other venous thrombosis and embolism: Secondary | ICD-10-CM | POA: Insufficient documentation

## 2021-07-11 DIAGNOSIS — Z888 Allergy status to other drugs, medicaments and biological substances status: Secondary | ICD-10-CM | POA: Diagnosis not present

## 2021-07-11 HISTORY — PX: CERVICAL CONIZATION W/BX: SHX1330

## 2021-07-11 HISTORY — PX: HYSTEROSCOPY WITH D & C: SHX1775

## 2021-07-11 LAB — TYPE AND SCREEN
ABO/RH(D): A POS
Antibody Screen: NEGATIVE

## 2021-07-11 LAB — POCT PREGNANCY, URINE: Preg Test, Ur: NEGATIVE

## 2021-07-11 LAB — ABO/RH: ABO/RH(D): A POS

## 2021-07-11 SURGERY — DILATATION AND CURETTAGE /HYSTEROSCOPY
Anesthesia: General | Site: Vagina

## 2021-07-11 MED ORDER — ONDANSETRON HCL 4 MG/2ML IJ SOLN
INTRAMUSCULAR | Status: AC
  Filled 2021-07-11: qty 2

## 2021-07-11 MED ORDER — KETOROLAC TROMETHAMINE 30 MG/ML IJ SOLN: 30.0000 mg | Freq: Once | INTRAMUSCULAR | Status: DC | PRN

## 2021-07-11 MED ORDER — LACTATED RINGERS IV SOLN: INTRAVENOUS | Status: DC

## 2021-07-11 MED ORDER — LIDOCAINE 2% (20 MG/ML) 5 ML SYRINGE
INTRAMUSCULAR | Status: DC | PRN
Start: 2021-07-11 — End: 2021-07-11
  Administered 2021-07-11: 100 mg via INTRAVENOUS

## 2021-07-11 MED ORDER — FENTANYL CITRATE (PF) 250 MCG/5ML IJ SOLN
INTRAMUSCULAR | Status: AC
  Filled 2021-07-11: qty 5

## 2021-07-11 MED ORDER — SODIUM CHLORIDE 0.9 % IR SOLN
Status: DC | PRN
  Administered 2021-07-11: 3000 mL

## 2021-07-11 MED ORDER — DROPERIDOL 2.5 MG/ML IJ SOLN
INTRAMUSCULAR | Status: DC | PRN
  Administered 2021-07-11: .625 mg via INTRAVENOUS

## 2021-07-11 MED ORDER — DEXAMETHASONE SODIUM PHOSPHATE 10 MG/ML IJ SOLN
INTRAMUSCULAR | Status: DC | PRN
  Administered 2021-07-11: 10 mg via INTRAVENOUS

## 2021-07-11 MED ORDER — ACETIC ACID 5 % SOLN
Status: AC
  Filled 2021-07-11: qty 50

## 2021-07-11 MED ORDER — MIDAZOLAM HCL 2 MG/2ML IJ SOLN
INTRAMUSCULAR | Status: AC
  Filled 2021-07-11: qty 2

## 2021-07-11 MED ORDER — OXYCODONE HCL 5 MG PO TABS: 5.0000 mg | ORAL_TABLET | Freq: Four times a day (QID) | ORAL | 0 refills | Status: DC | PRN

## 2021-07-11 MED ORDER — FENTANYL CITRATE (PF) 100 MCG/2ML IJ SOLN: 25.0000 ug | INTRAMUSCULAR | Status: DC | PRN

## 2021-07-11 MED ORDER — FERRIC SUBSULFATE 259 MG/GM EX SOLN
CUTANEOUS | Status: AC
  Filled 2021-07-11: qty 8

## 2021-07-11 MED ORDER — ROCURONIUM BROMIDE 10 MG/ML (PF) SYRINGE
PREFILLED_SYRINGE | INTRAVENOUS | Status: DC | PRN
  Administered 2021-07-11: 50 mg via INTRAVENOUS

## 2021-07-11 MED ORDER — MIDAZOLAM HCL 5 MG/5ML IJ SOLN
INTRAMUSCULAR | Status: DC | PRN
  Administered 2021-07-11: 2 mg via INTRAVENOUS

## 2021-07-11 MED ORDER — PROMETHAZINE HCL 25 MG/ML IJ SOLN: 6.2500 mg | INTRAMUSCULAR | Status: DC | PRN

## 2021-07-11 MED ORDER — ONDANSETRON HCL 4 MG/2ML IJ SOLN
INTRAMUSCULAR | Status: DC | PRN
  Administered 2021-07-11: 4 mg via INTRAVENOUS

## 2021-07-11 MED ORDER — ORAL CARE MOUTH RINSE: 15.0000 mL | Freq: Once | OROMUCOSAL | Status: AC

## 2021-07-11 MED ORDER — FERRIC SUBSULFATE 259 MG/GM EX SOLN
CUTANEOUS | Status: DC | PRN
  Administered 2021-07-11: 1 via TOPICAL

## 2021-07-11 MED ORDER — SUCCINYLCHOLINE CHLORIDE 200 MG/10ML IV SOSY
PREFILLED_SYRINGE | INTRAVENOUS | Status: DC | PRN
  Administered 2021-07-11: 100 mg via INTRAVENOUS

## 2021-07-11 MED ORDER — SUGAMMADEX SODIUM 200 MG/2ML IV SOLN
INTRAVENOUS | Status: DC | PRN
  Administered 2021-07-11: 300 mg via INTRAVENOUS

## 2021-07-11 MED ORDER — OXYCODONE HCL 5 MG PO TABS: 5.0000 mg | ORAL_TABLET | Freq: Once | ORAL | Status: DC | PRN

## 2021-07-11 MED ORDER — CHLORHEXIDINE GLUCONATE 0.12 % MT SOLN
15.0000 mL | Freq: Once | OROMUCOSAL | Status: AC
  Administered 2021-07-11: 15 mL via OROMUCOSAL
  Filled 2021-07-11: qty 15

## 2021-07-11 MED ORDER — HEMOSTATIC AGENTS (NO CHARGE) OPTIME
TOPICAL | Status: DC | PRN
  Administered 2021-07-11: 1 via TOPICAL

## 2021-07-11 MED ORDER — BUPIVACAINE HCL (PF) 0.25 % IJ SOLN
INTRAMUSCULAR | Status: AC
  Filled 2021-07-11: qty 10

## 2021-07-11 MED ORDER — LIDOCAINE 2% (20 MG/ML) 5 ML SYRINGE
INTRAMUSCULAR | Status: AC
  Filled 2021-07-11: qty 5

## 2021-07-11 MED ORDER — PROPOFOL 10 MG/ML IV BOLUS
INTRAVENOUS | Status: DC | PRN
  Administered 2021-07-11: 200 mg via INTRAVENOUS

## 2021-07-11 MED ORDER — LIDOCAINE-EPINEPHRINE 1 %-1:100000 IJ SOLN
INTRAMUSCULAR | Status: DC | PRN
  Administered 2021-07-11: 10 mL

## 2021-07-11 MED ORDER — OXYCODONE HCL 5 MG/5ML PO SOLN
5.0000 mg | Freq: Once | ORAL | Status: DC | PRN
Start: 2021-07-11 — End: 2021-07-11

## 2021-07-11 MED ORDER — FENTANYL CITRATE (PF) 250 MCG/5ML IJ SOLN
INTRAMUSCULAR | Status: DC | PRN
  Administered 2021-07-11: 150 ug via INTRAVENOUS
  Administered 2021-07-11: 50 ug via INTRAVENOUS

## 2021-07-11 MED ORDER — LIDOCAINE-EPINEPHRINE 1 %-1:100000 IJ SOLN
INTRAMUSCULAR | Status: AC
  Filled 2021-07-11: qty 1

## 2021-07-11 MED ORDER — DEXAMETHASONE SODIUM PHOSPHATE 10 MG/ML IJ SOLN
INTRAMUSCULAR | Status: AC
  Filled 2021-07-11: qty 1

## 2021-07-11 MED ORDER — PROPOFOL 10 MG/ML IV BOLUS
INTRAVENOUS | Status: AC
  Filled 2021-07-11: qty 20

## 2021-07-11 MED ORDER — IODINE STRONG (LUGOLS) 5 % PO SOLN
ORAL | Status: AC
  Filled 2021-07-11: qty 1

## 2021-07-11 MED ORDER — PHENYLEPHRINE 40 MCG/ML (10ML) SYRINGE FOR IV PUSH (FOR BLOOD PRESSURE SUPPORT)
PREFILLED_SYRINGE | INTRAVENOUS | Status: AC
  Filled 2021-07-11: qty 10

## 2021-07-11 SURGICAL SUPPLY — 35 items
BIPOLAR CUTTING LOOP 21FR (ELECTRODE)
BLADE SURG 11 STRL SS (BLADE) ×2 IMPLANT
CATH ROBINSON RED A/P 16FR (CATHETERS) IMPLANT
CATH URET ROBINSON 16FR STRL (CATHETERS) ×2 IMPLANT
CNTNR URN SCR LID CUP LEK RST (MISCELLANEOUS) ×3 IMPLANT
CONT SPEC 4OZ STRL OR WHT (MISCELLANEOUS) ×3
DEVICE MYOSURE REACH (MISCELLANEOUS) ×2 IMPLANT
DILATOR CANAL MILEX (MISCELLANEOUS) IMPLANT
ELECT BALL LEEP 5MM RED (ELECTRODE) ×2 IMPLANT
ELECT BLADE 6.5 EXT (BLADE) ×2 IMPLANT
ELECT REM PT RETURN 9FT ADLT (ELECTROSURGICAL) ×2
ELECTRODE REM PT RTRN 9FT ADLT (ELECTROSURGICAL) ×1 IMPLANT
GLOVE SURG ENC MOIS LTX SZ6.5 (GLOVE) ×4 IMPLANT
GLOVE SURG UNDER POLY LF SZ6.5 (GLOVE) ×2 IMPLANT
GLOVE SURG UNDER POLY LF SZ7 (GLOVE) ×2 IMPLANT
GOWN STRL REUS W/ TWL LRG LVL3 (GOWN DISPOSABLE) ×2 IMPLANT
GOWN STRL REUS W/TWL LRG LVL3 (GOWN DISPOSABLE) ×2
HEMOSTAT SURGICEL 4X8 (HEMOSTASIS) ×2 IMPLANT
KIT PROCEDURE FLUENT (KITS) ×2 IMPLANT
KIT TURNOVER KIT B (KITS) ×2 IMPLANT
LOOP CUTTING BIPOLAR 21FR (ELECTRODE) IMPLANT
NS IRRIG 1000ML POUR BTL (IV SOLUTION) ×2 IMPLANT
PACK VAGINAL MINOR WOMEN LF (CUSTOM PROCEDURE TRAY) ×2 IMPLANT
PAD OB MATERNITY 4.3X12.25 (PERSONAL CARE ITEMS) ×2 IMPLANT
PENCIL BUTTON HOLSTER BLD 10FT (ELECTRODE) ×2 IMPLANT
PENCIL SMOKE EVACUATOR (MISCELLANEOUS) IMPLANT
SCOPETTES 8  STERILE (MISCELLANEOUS) ×1
SCOPETTES 8 STERILE (MISCELLANEOUS) ×1 IMPLANT
SPONGE SURGIFOAM ABS GEL 12-7 (HEMOSTASIS) IMPLANT
SUT SILK 2 0 SH (SUTURE) ×2 IMPLANT
SUT VIC AB 0 CT1 27 (SUTURE) ×2
SUT VIC AB 0 CT1 27XBRD ANBCTR (SUTURE) ×2 IMPLANT
TOWEL GREEN STERILE FF (TOWEL DISPOSABLE) ×4 IMPLANT
TUBE CONNECTING 12X1/4 (SUCTIONS) ×2 IMPLANT
YANKAUER SUCT BULB TIP NO VENT (SUCTIONS) ×2 IMPLANT

## 2021-07-11 NOTE — Anesthesia Postprocedure Evaluation (Signed)
Anesthesia Post Note  Patient: Maelani James  Procedure(s) Performed: DILATATION AND CURETTAGE /HYSTEROSCOPY WITH MYOSURE (Vagina ) CONIZATION CERVIX WITH BIOPSY (Vagina )     Patient location during evaluation: PACU Anesthesia Type: General Level of consciousness: awake and alert Pain management: pain level controlled Vital Signs Assessment: post-procedure vital signs reviewed and stable Respiratory status: spontaneous breathing, nonlabored ventilation, respiratory function stable and patient connected to nasal cannula oxygen Cardiovascular status: blood pressure returned to baseline and stable Postop Assessment: no apparent nausea or vomiting Anesthetic complications: no   No notable events documented.  Last Vitals:  Vitals:   07/11/21 0859 07/11/21 0912  BP: 103/70 (!) 120/97  Pulse: 68 80  Resp: 20 (!) 24  Temp:  36.8 C  SpO2: 100% 100%    Last Pain:  Vitals:   07/11/21 0912  TempSrc:   PainSc: 0-No pain                 Olyn Landstrom S

## 2021-07-11 NOTE — Anesthesia Procedure Notes (Signed)
Procedure Name: Intubation Date/Time: 07/11/2021 7:37 AM Performed by: Lavell Luster, CRNA Pre-anesthesia Checklist: Patient identified, Emergency Drugs available, Suction available, Timeout performed and Patient being monitored Patient Re-evaluated:Patient Re-evaluated prior to induction Oxygen Delivery Method: Circle system utilized Preoxygenation: Pre-oxygenation with 100% oxygen Induction Type: IV induction Ventilation: Mask ventilation without difficulty Laryngoscope Size: Mac and 3 Grade View: Grade I Tube type: Oral Tube size: 7.5 mm Number of attempts: 1 Airway Equipment and Method: Stylet Placement Confirmation: ETT inserted through vocal cords under direct vision, positive ETCO2 and breath sounds checked- equal and bilateral Secured at: 23 cm Tube secured with: Tape Dental Injury: Teeth and Oropharynx as per pre-operative assessment

## 2021-07-11 NOTE — Op Note (Addendum)
Pre Op Dx:   1. Abnormal uterine bleeding 2. High grade cervical dysplasia (CIN II-III) 3. History of DVT/PE on anticoagulation (Xarelto '20mg'$  daily) 4. Failed medical management of AUB  Post Op Dx:   1. Abnormal uterine bleeding 2. High grade cervical dysplasia (CIN II-III) 3. History of DVT/PE on anticoagulation (Xarelto '20mg'$  daily) 4. Failed medical management of AUB 5. Endometrial polyps  Procedure:   1. Hysteroscopy with Dilation and Curettage 2. Myosure Polypectomy 3. Cold Knife Conization   Surgeon:  Dr. Drema Dallas Assistants:  None Anesthesia:  General   EBL:  20cc  IVF:  700cc UOP:  75cc via in-and-out catheter prior to start of case  Fluid Deficit:  125cc   Drains: In-and-out foley catheter Specimen removed:  Endometrial curettings, endometrial polyp, and cervical conization - all sent to pathology Device(s) implanted: None Case Type:  Clean-contaminated Findings:  Normal-appearing cervix. Uterus sounded to 7cm. Anterior endometrium with broad based polyps otherwise normal endometrium. Cervical canal appeared normal. Bilateral tubal ostia visualized.  Complications: None Indications:  30 y.o. G0 with history of DVT/PE (anticoagulated) with failed medical management of AUB and high grade cervical dysplasia (CIN II-III).  Description of each procedure:  After informed consent was obtained the patient was taken to the operating room in the dorsal supine position.  After administration of general anesthesia, the patient was placed in the dorsal lithotomy position and prepped and draped in the usual sterile fashion.  Her bladder was emptied using an in and out catheter.  A pre-operative time-out was completed.  The anterior lip of the cervix was grasped with a single-tooth tenaculum and the cervix was serially dilated to accommodate the hysteroscope. The hysteroscope was advanced and the findings as above was noted. The Myosure Reach was used to resect the endometrial  polyps. A smooth banjo curette was used to curettage the endometrium. Adequate hemostasis was noted.  Attention then turned to perform cold knife conization. Two 0-Vicryl sutures were placed at 3 and 9 o'clock on the cervix for traction and hemostasis. Cervical and paracervical block using 10cc of 1% Lidocaine with epinephrine placed. A circumferential cervical incision was initiated and the specimen was tagged at 12 o'clock.  The cone biopsy was completed sharply and passed off the field.  The bed of the cone biopsy was made hemostatic with electrosurgery and Monsel's. A piece of Surgicel was then tied in place over the cone bed for additional hemostasis.  She was returned to dorsal supine position, awakened and extubated having appeared to tolerate the procedure well.  All counts were correct.  She was transferred to PACU in good condition  Disposition:  PACU  Drema Dallas, DO

## 2021-07-11 NOTE — Interval H&P Note (Signed)
History and Physical Interval Note:  07/11/2021 7:26 AM  [name]  has presented today for surgery, with the diagnosis of Pelvic Pain Severe Dysplasia of Cervix.  The various methods of treatment have been discussed with the patient and family. After consideration of risks, benefits and other options for treatment, the patient has consented to  Procedure(s): DILATATION AND CURETTAGE /HYSTEROSCOPY (N/A) CONIZATION CERVIX WITH BIOPSY (N/A) as a surgical intervention.  The patient's history has been reviewed, patient examined, no change in status, stable for surgery.  I have reviewed the patient's chart and labs.  Questions were answered to the patient's satisfaction.     Nina Morrow

## 2021-07-11 NOTE — Transfer of Care (Signed)
Immediate Anesthesia Transfer of Care Note  Patient: Nina Morrow  Procedure(s) Performed: DILATATION AND CURETTAGE /HYSTEROSCOPY WITH MYOSURE (Vagina ) CONIZATION CERVIX WITH BIOPSY (Vagina )  Patient Location: PACU  Anesthesia Type:General  Level of Consciousness: awake, alert  and oriented  Airway & Oxygen Therapy: Patient connected to face mask oxygen  Post-op Assessment: Post -op Vital signs reviewed and stable  Post vital signs: stable  Last Vitals:  Vitals Value Taken Time  BP    Temp    Pulse    Resp 27 07/11/21 0842  SpO2    Vitals shown include unvalidated device data.  Last Pain:  Vitals:   07/11/21 0602  TempSrc:   PainSc: 4          Complications: No notable events documented.

## 2021-07-12 ENCOUNTER — Encounter (HOSPITAL_COMMUNITY): Payer: Self-pay | Admitting: Obstetrics and Gynecology

## 2021-07-14 ENCOUNTER — Inpatient Hospital Stay: Payer: 59

## 2021-07-14 LAB — SURGICAL PATHOLOGY

## 2021-07-18 ENCOUNTER — Encounter: Payer: Self-pay | Admitting: Physician Assistant

## 2021-07-20 ENCOUNTER — Other Ambulatory Visit: Payer: Self-pay | Admitting: *Deleted

## 2021-07-20 ENCOUNTER — Encounter: Payer: Self-pay | Admitting: Physician Assistant

## 2021-07-20 MED ORDER — RIVAROXABAN 20 MG PO TABS: 20.0000 mg | ORAL_TABLET | Freq: Every day | ORAL | 3 refills | Status: DC

## 2021-07-21 ENCOUNTER — Inpatient Hospital Stay: Payer: 59 | Attending: Physician Assistant

## 2021-07-21 ENCOUNTER — Other Ambulatory Visit: Payer: Self-pay

## 2021-07-21 DIAGNOSIS — E538 Deficiency of other specified B group vitamins: Secondary | ICD-10-CM | POA: Insufficient documentation

## 2021-07-21 DIAGNOSIS — Z86718 Personal history of other venous thrombosis and embolism: Secondary | ICD-10-CM | POA: Insufficient documentation

## 2021-07-21 DIAGNOSIS — D509 Iron deficiency anemia, unspecified: Secondary | ICD-10-CM | POA: Diagnosis present

## 2021-07-21 DIAGNOSIS — Z86711 Personal history of pulmonary embolism: Secondary | ICD-10-CM | POA: Diagnosis present

## 2021-07-21 DIAGNOSIS — D500 Iron deficiency anemia secondary to blood loss (chronic): Secondary | ICD-10-CM

## 2021-07-21 MED ORDER — CYANOCOBALAMIN 1000 MCG/ML IJ SOLN
1000.0000 ug | Freq: Once | INTRAMUSCULAR | Status: AC
  Administered 2021-07-21: 1000 ug via INTRAMUSCULAR
  Filled 2021-07-21: qty 1

## 2021-08-10 ENCOUNTER — Other Ambulatory Visit: Payer: Self-pay | Admitting: Physician Assistant

## 2021-08-10 DIAGNOSIS — D500 Iron deficiency anemia secondary to blood loss (chronic): Secondary | ICD-10-CM

## 2021-08-10 DIAGNOSIS — E538 Deficiency of other specified B group vitamins: Secondary | ICD-10-CM

## 2021-08-11 ENCOUNTER — Inpatient Hospital Stay: Payer: 59 | Attending: Physician Assistant | Admitting: Physician Assistant

## 2021-08-11 ENCOUNTER — Other Ambulatory Visit: Payer: Self-pay

## 2021-08-11 ENCOUNTER — Inpatient Hospital Stay: Payer: 59

## 2021-08-11 VITALS — BP 131/80 | HR 88 | Temp 97.2°F | Resp 18 | Wt 291.0 lb

## 2021-08-11 DIAGNOSIS — K909 Intestinal malabsorption, unspecified: Secondary | ICD-10-CM | POA: Insufficient documentation

## 2021-08-11 DIAGNOSIS — D509 Iron deficiency anemia, unspecified: Secondary | ICD-10-CM | POA: Insufficient documentation

## 2021-08-11 DIAGNOSIS — Z836 Family history of other diseases of the respiratory system: Secondary | ICD-10-CM | POA: Diagnosis not present

## 2021-08-11 DIAGNOSIS — K5900 Constipation, unspecified: Secondary | ICD-10-CM | POA: Insufficient documentation

## 2021-08-11 DIAGNOSIS — D500 Iron deficiency anemia secondary to blood loss (chronic): Secondary | ICD-10-CM | POA: Diagnosis not present

## 2021-08-11 DIAGNOSIS — Z7901 Long term (current) use of anticoagulants: Secondary | ICD-10-CM | POA: Insufficient documentation

## 2021-08-11 DIAGNOSIS — R55 Syncope and collapse: Secondary | ICD-10-CM | POA: Diagnosis not present

## 2021-08-11 DIAGNOSIS — Z800 Family history of malignant neoplasm of digestive organs: Secondary | ICD-10-CM | POA: Insufficient documentation

## 2021-08-11 DIAGNOSIS — F319 Bipolar disorder, unspecified: Secondary | ICD-10-CM | POA: Diagnosis not present

## 2021-08-11 DIAGNOSIS — Z87891 Personal history of nicotine dependence: Secondary | ICD-10-CM | POA: Diagnosis not present

## 2021-08-11 DIAGNOSIS — Z86711 Personal history of pulmonary embolism: Secondary | ICD-10-CM | POA: Insufficient documentation

## 2021-08-11 DIAGNOSIS — R0602 Shortness of breath: Secondary | ICD-10-CM | POA: Insufficient documentation

## 2021-08-11 DIAGNOSIS — Z803 Family history of malignant neoplasm of breast: Secondary | ICD-10-CM | POA: Insufficient documentation

## 2021-08-11 DIAGNOSIS — Z801 Family history of malignant neoplasm of trachea, bronchus and lung: Secondary | ICD-10-CM | POA: Diagnosis not present

## 2021-08-11 DIAGNOSIS — E538 Deficiency of other specified B group vitamins: Secondary | ICD-10-CM | POA: Insufficient documentation

## 2021-08-11 DIAGNOSIS — Z823 Family history of stroke: Secondary | ICD-10-CM | POA: Diagnosis not present

## 2021-08-11 DIAGNOSIS — Z86718 Personal history of other venous thrombosis and embolism: Secondary | ICD-10-CM | POA: Diagnosis present

## 2021-08-11 LAB — CBC WITH DIFFERENTIAL (CANCER CENTER ONLY)
Abs Immature Granulocytes: 0.01 10*3/uL (ref 0.00–0.07)
Basophils Absolute: 0 10*3/uL (ref 0.0–0.1)
Basophils Relative: 0 %
Eosinophils Absolute: 0.2 10*3/uL (ref 0.0–0.5)
Eosinophils Relative: 4 %
HCT: 37.5 % (ref 36.0–46.0)
Hemoglobin: 12 g/dL (ref 12.0–15.0)
Immature Granulocytes: 0 %
Lymphocytes Relative: 50 %
Lymphs Abs: 2.2 10*3/uL (ref 0.7–4.0)
MCH: 26.9 pg (ref 26.0–34.0)
MCHC: 32 g/dL (ref 30.0–36.0)
MCV: 84.1 fL (ref 80.0–100.0)
Monocytes Absolute: 0.4 10*3/uL (ref 0.1–1.0)
Monocytes Relative: 9 %
Neutro Abs: 1.7 10*3/uL (ref 1.7–7.7)
Neutrophils Relative %: 37 %
Platelet Count: 286 10*3/uL (ref 150–400)
RBC: 4.46 MIL/uL (ref 3.87–5.11)
RDW: 16.6 % — ABNORMAL HIGH (ref 11.5–15.5)
WBC Count: 4.5 10*3/uL (ref 4.0–10.5)
nRBC: 0 % (ref 0.0–0.2)

## 2021-08-11 LAB — FERRITIN: Ferritin: 8 ng/mL — ABNORMAL LOW (ref 11–307)

## 2021-08-11 LAB — RETIC PANEL
Immature Retic Fract: 11.2 % (ref 2.3–15.9)
RBC.: 4.46 MIL/uL (ref 3.87–5.11)
Retic Count, Absolute: 50.8 10*3/uL (ref 19.0–186.0)
Retic Ct Pct: 1.1 % (ref 0.4–3.1)
Reticulocyte Hemoglobin: 27.5 pg — ABNORMAL LOW (ref >27.9)

## 2021-08-11 LAB — VITAMIN B12: Vitamin B-12: 267 pg/mL (ref 180–914)

## 2021-08-11 LAB — IRON AND TIBC
Iron: 45 ug/dL (ref 28–170)
Saturation Ratios: 10 % — ABNORMAL LOW (ref 10.4–31.8)
TIBC: 467 ug/dL — ABNORMAL HIGH (ref 250–450)
UIBC: 422 ug/dL

## 2021-08-11 MED ORDER — CYANOCOBALAMIN 1000 MCG/ML IJ SOLN
1000.0000 ug | Freq: Once | INTRAMUSCULAR | Status: AC
  Administered 2021-08-11: 1000 ug via INTRAMUSCULAR
  Filled 2021-08-11: qty 1

## 2021-08-11 NOTE — Progress Notes (Addendum)
[place] [phone]   Fax:([phone]) [place] NOTE  Patient Care Team: Trey Sailors, PA as PCP - General (Physician Assistant)  Hematological/Oncological History #Recurrent DVTs/PE: 1) 06/2010: Right leg DVT and PE treated with warfarin x 3 months-felt to be provoked by OCPs 2) 2015: Left Leg DVT. Felt to be provoked by OCPs. Recommended indefinite AC and treated with warfarin. Patient self-discontinued after 30months 3) 12/2017: Right leg DVT and PE treated with warfarin-no provoking factor since not on OCPS.  4) 03/14/2020: Checked Factor V Leiden mutation, Prothrombin Mutation, Lupus anticoagulant-Not present 5) 03/30/2020: Switch to Eliquis.  6) 10/2020: Discontinued Eliquis due to cost.  7) 02/02/2021: Establish care with [name] PA-C. Recommend indefinite anticoagulation with Xarelto.   #Iron deficiency anemia: 1) 01/20/2021: Iron panel from [facility] revealed IDA with total iron 36, iron saturation 8%, ferritin 4.  2) 02/17/2021-04/14/2021: Received IV venofer x 5 doses.  3) 05/30/2021-08/11/2021: Received IV venofer x 3 doses  #Vitamin B12 deficiency: 1)05/30/2021-Present: Started once monthly vitamin B12 injections  CHIEF COMPLAINT: -History of multiple DVT and PE -Iron deficiency anemia  -Vitamin B12 deficiency  HISTORY OF PRESENTING ILLNESS:  Nina Morrow 30 y.o. female who returns for a follow up for history of DVTs/PE while on Xarelto, iron deficiency and vitamin B12 deficiency. Since the last visit on 05/12/2021, patient underwent   Patient notes that her energy levels have improved since receiving IV iron. She is currently under evaluation by cardiology for having multiple syncopal episodes. She is uncertain the underlying cause but lately, she has been having episodes in the shower. She wore a Holter monitor for 30 days last month to evaluate for arrhythmias but is not aware of the results. Her appetite is good  without any noticeable weight changes. She deneis any nausea, vomiting or abdominal pain. Her bowel movements are regular without any diarrhea or constipation. She recently switched to Norethindrone birth control pills after her IUD spontaneously fell out. She notes that her menstrual bleeding has improved some but she notices more clots. She has mild constipation that improves with daily intake of milk of magnesia. She denies any hematochezia or melena. Patient has persistent shortness of breath, mainly with exertion. rest. She denies any fevers, chills, chest pain or cough. She has no other complaints. Rest of the 10 point ROS is below.   MEDICAL HISTORY:  Past Medical History:  Diagnosis Date   Anemia    Anxiety    Bipolar 1 disorder ([place])    with depression and anxiety. stopped taking lithium december 2021.    Cancer ([place])    Cervical   Depression    DVT (deep venous thrombosis) (HCC)    GERD (gastroesophageal reflux disease)    Hallucination    Headache    Palpitation    PCOS (polycystic ovarian syndrome)    Pneumonia    Pulmonary emboli (H[facility])    Syncope and collapse     SURGICAL HISTORY: Past Surgical History:  Procedure Laterality Date   BARIATRIC SURGERY     CERVICAL CONIZATION W/BX N/A 07/11/2021   Procedure: CONIZATION CERVIX WITH BIOPSY;  Surgeon: Drema Dallas, DO;  Location: [facility];  Service: Gynecology;  Laterality: N/A;   HYSTEROSCOPY WITH D & C N/A 07/11/2021   Procedure: DILATATION AND CURETTAGE /HYSTEROSCOPY WITH MYOSURE;  Surgeon: Drema Dallas, DO;  Location: [facility];  Service: Gynecology;  Laterality: N/A;   TONSILLECTOMY     TONSILLECTOMY AND ADENOIDECTOMY  SOCIAL HISTORY: Social History   Socioeconomic History   Marital status: Significant Other    Spouse name: Not on file   Number of children: Not on file   Years of education: Not on file   Highest education level: Not on file  Occupational History   Not on file  Tobacco Use   Smoking status:  Former    Years: 2.00    Types: E-cigarettes, Cigarettes, Cigars    Quit date: 02/04/2012    Years since quitting: 9.5   Smokeless tobacco: Never   Tobacco comments:    Used to smoke 1 pack a month  Vaping Use   Vaping Use: Former  Substance and Sexual Activity   Alcohol use: Yes    Comment: socially. 1-2 drinks/month   Drug use: Not Currently    Types: Marijuana    Comment: vapes TH; occasional mushrooms   Sexual activity: Not on file  Other Topics Concern   Not on file  Social History Narrative   Not on file   Social Determinants of Health   Financial Resource Strain: Not on file  Food Insecurity: Not on file  Transportation Needs: Not on file  Physical Activity: Not on file  Stress: Not on file  Social Connections: Not on file  Intimate Partner Violence: Not on file    FAMILY HISTORY: Family History  Problem Relation Age of Onset   Transient ischemic attack Mother        2015   Pulmonary embolism Sister        May 2021   Breast cancer Maternal Grandmother    Stomach cancer Maternal Grandfather    Lung cancer Maternal Grandfather    Breast cancer Cousin     ALLERGIES:  is allergic to aripiprazole, grass extracts [gramineae pollens], and latex.  MEDICATIONS:  Current Outpatient Medications  Medication Sig Dispense Refill   albuterol (VENTOLIN HFA) 108 (90 Base) MCG/ACT inhaler Inhale 2 puffs into the lungs every 6 (six) hours as needed for wheezing or shortness of breath. prn     buPROPion (WELLBUTRIN XL) 150 MG 24 hr tablet Take 150 mg by mouth daily.     Magnesium Oxide 200 MG TABS Take 200 mg by mouth daily.     norethindrone (MICRONOR) 0.35 MG tablet Take 1 tablet by mouth daily.     rivaroxaban (XARELTO) 20 MG TABS tablet Take 1 tablet (20 mg total) by mouth daily with supper. 30 tablet 3   topiramate (TOPAMAX) 100 MG tablet Take 100 mg by mouth daily.     traZODone (DESYREL) 100 MG tablet Take 100 mg by mouth at bedtime.     albuterol (VENTOLIN HFA) 108  (90 Base) MCG/ACT inhaler Inhale 1 Inhaler into the lungs every 6 (six) hours. (Patient not taking: Reported on 08/11/2021)     No current facility-administered medications for this visit.    REVIEW OF SYSTEMS:   Constitutional: ( - ) fevers, ( - )  chills , (  ) night sweats Eyes: ( - ) blurriness of vision, ( - ) double vision, ( - ) watery eyes Ears, nose, mouth, throat, and face: ( - ) mucositis, ( - ) sore throat Respiratory: ( - ) cough, ( + ) dyspnea, ( - ) wheezes Cardiovascular: ( - ) palpitation, ( - ) chest discomfort, ( - ) lower extremity swelling Gastrointestinal:  (- ) nausea, ( - ) heartburn, ( - ) change in bowel habits Skin: ( - ) abnormal skin rashes Lymphatics: ( - )  new lymphadenopathy, ( - ) easy bruising Neurological: ( - ) numbness, ( - ) tingling, ( - ) new weaknesses Behavioral/Psych: ( - ) mood change, ( - ) new changes  All other systems were reviewed with the patient and are negative.  PHYSICAL EXAMINATION: ECOG PERFORMANCE STATUS: 1 - Symptomatic but completely ambulatory  Vitals:   08/11/21 1240  BP: 131/80  Pulse: 88  Resp: 18  Temp: (!) 97.2 F (36.2 C)  SpO2: 99%   Filed Weights   08/11/21 1240  Weight: 291 lb (132 kg)    GENERAL: well appearing african [place] female in NAD  SKIN: skin color, texture, turgor are normal, no rashes or significant lesions EYES: conjunctiva are pink and non-injected, sclera clear OROPHARYNX: no exudate, no erythema; lips, buccal mucosa, and tongue normal  NECK: supple, non-tender LYMPH:  no palpable lymphadenopathy in the cervical, axillary or supraclavicular lymph nodes.  LUNGS: clear to auscultation and percussion with normal breathing effort HEART: regular rate & rhythm and no murmurs and no lower extremity edema ABDOMEN: soft, non-tender, non-distended, normal bowel sounds Musculoskeletal: no cyanosis of digits and no clubbing  PSYCH: alert & oriented x 3, fluent speech NEURO: no focal motor/sensory  deficits  LABORATORY DATA:  I have reviewed the data as listed CBC Latest Ref Rng & Units 08/11/2021 06/26/2021 05/26/2021  WBC 4.0 - 10.5 K/uL 4.5 5.0 3.0(L)  Hemoglobin 12.0 - 15.0 g/dL 12.0 12.2 12.4  Hematocrit 36.0 - 46.0 % 37.5 39.2 39.9  Platelets 150 - 400 K/uL 286 291 307    CMP Latest Ref Rng & Units 06/26/2021 05/26/2021 02/03/2021  Glucose 70 - 99 mg/dL 89 88 87  BUN 6 - 20 mg/dL 9 6 7   Creatinine 0.44 - 1.00 mg/dL 0.83 0.76 0.84  Sodium 135 - 145 mmol/L 141 137 140  Potassium 3.5 - 5.1 mmol/L 3.9 4.1 4.0  Chloride 98 - 111 mmol/L 108 103 104  CO2 22 - 32 mmol/L 27 26 24   Calcium 8.9 - 10.3 mg/dL 8.4(L) 9.4 8.8(L)  Total Protein 6.5 - 8.1 g/dL 6.7 7.3 8.1  Total Bilirubin 0.3 - 1.2 mg/dL 0.2(L) 0.8 0.6  Alkaline Phos 38 - 126 U/L 61 68 80  AST 15 - 41 U/L 17 21 16   ALT 0 - 44 U/L 18 19 15    ASSESSMENT & PLAN Nina Morrow is a 30 y.o. female returns for a follow up for history of recurrent DVT/PE along with vitamin B12 and iron deficiency anemia.   #Multiple DVT's and PE's: --Hypercoagulable workup was completed by [facility] in May 2021 which included Factor V Leiden mutation, Prothrombin Mutation, Lupus anticoagulant, all three were unremarkable.  -Remaining hypercoagulable workup was done here on 02/03/2021 checking protein C and protein S activity, beta-2 glycoprotein antibody and cardiolipin antibody. Findings were unremarkable except for mild Protein S activity.  --Recommend indefinite anticoagulation due to recurrent DVT/PEs.  --Currently on Xarelto with good tolerance.   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  --Patient recently currently on progesterone containing birth control pills.   --Patient underwent Cold Knife Conization of the cervix on 07/11/2021 and scheduled to  undergo hysterectomy on 11/15/2021.  --Received once a week IV venofer x 8 doses since 02/17/2021.  --Labs today show anemia has resolved but there is persistent iron deficiency. Iron saturation is 10% and Ferritin  is 8. --Recommend to proceed with once a month IV venofer until patient's surgery in January 2023.   #B12 deficiency: --Labs today show vitamin B12  levels have improved from 177 to 267,  --Secondary to gastric sleeve surgery.  --Recommend to continue IM B12 injection once a month.   #Weight gain: --Patient is frustrated with her weight gain and suspects it is a side effect from her medication.  --She is s/p bariatric surgery. I will request a follow up with her weight management team at [facility].   Follow up: --Labs followed by IV venofer and B12 injections in 4 weeks and 8 weeks --RTC in 12 weeks with labs.    No orders of the defined types were placed in this encounter.   All questions were answered. The patient knows to call the clinic with any problems, questions or concerns.  I have spent a total of 30 minutes minutes of face-to-face and non-face-to-face time, preparing to see the patient, obtaining and/or reviewing separately obtained history, performing a medically appropriate examination, counseling and educating the patient, ordering medications/tests, documenting clinical information in the electronic health record, and care coordination.   [name], PA-C Department of Hematology/Oncology [place] [facility] Phone: [phone]

## 2021-08-11 NOTE — Progress Notes (Signed)
[name] PA confirms Pt can receive B12 inj a week early today.

## 2021-08-14 ENCOUNTER — Encounter: Payer: Self-pay | Admitting: Physician Assistant

## 2021-08-14 LAB — METHYLMALONIC ACID, SERUM: Methylmalonic Acid, Quantitative: 197 nmol/L (ref 0–378)

## 2021-09-08 ENCOUNTER — Other Ambulatory Visit: Payer: Self-pay

## 2021-09-08 ENCOUNTER — Inpatient Hospital Stay: Payer: 59 | Attending: Physician Assistant

## 2021-09-08 ENCOUNTER — Inpatient Hospital Stay: Payer: 59

## 2021-09-08 VITALS — BP 125/82 | HR 79 | Temp 98.8°F | Resp 18

## 2021-09-08 DIAGNOSIS — Z86711 Personal history of pulmonary embolism: Secondary | ICD-10-CM | POA: Diagnosis present

## 2021-09-08 DIAGNOSIS — E538 Deficiency of other specified B group vitamins: Secondary | ICD-10-CM | POA: Insufficient documentation

## 2021-09-08 DIAGNOSIS — D500 Iron deficiency anemia secondary to blood loss (chronic): Secondary | ICD-10-CM

## 2021-09-08 DIAGNOSIS — Z86718 Personal history of other venous thrombosis and embolism: Secondary | ICD-10-CM | POA: Insufficient documentation

## 2021-09-08 DIAGNOSIS — Z79899 Other long term (current) drug therapy: Secondary | ICD-10-CM | POA: Diagnosis not present

## 2021-09-08 DIAGNOSIS — N920 Excessive and frequent menstruation with regular cycle: Secondary | ICD-10-CM | POA: Insufficient documentation

## 2021-09-08 DIAGNOSIS — K909 Intestinal malabsorption, unspecified: Secondary | ICD-10-CM | POA: Diagnosis not present

## 2021-09-08 LAB — CBC WITH DIFFERENTIAL (CANCER CENTER ONLY)
Abs Immature Granulocytes: 0 10*3/uL (ref 0.00–0.07)
Basophils Absolute: 0 10*3/uL (ref 0.0–0.1)
Basophils Relative: 1 %
Eosinophils Absolute: 0.2 10*3/uL (ref 0.0–0.5)
Eosinophils Relative: 5 %
HCT: 41.1 % (ref 36.0–46.0)
Hemoglobin: 13 g/dL (ref 12.0–15.0)
Immature Granulocytes: 0 %
Lymphocytes Relative: 50 %
Lymphs Abs: 2.1 10*3/uL (ref 0.7–4.0)
MCH: 26.5 pg (ref 26.0–34.0)
MCHC: 31.6 g/dL (ref 30.0–36.0)
MCV: 83.7 fL (ref 80.0–100.0)
Monocytes Absolute: 0.3 10*3/uL (ref 0.1–1.0)
Monocytes Relative: 6 %
Neutro Abs: 1.6 10*3/uL — ABNORMAL LOW (ref 1.7–7.7)
Neutrophils Relative %: 38 %
Platelet Count: 271 10*3/uL (ref 150–400)
RBC: 4.91 MIL/uL (ref 3.87–5.11)
RDW: 15.2 % (ref 11.5–15.5)
WBC Count: 4.2 10*3/uL (ref 4.0–10.5)
nRBC: 0 % (ref 0.0–0.2)

## 2021-09-08 LAB — IRON AND TIBC
Iron: 77 ug/dL (ref 41–142)
Saturation Ratios: 21 % (ref 21–57)
TIBC: 373 ug/dL (ref 236–444)
UIBC: 296 ug/dL (ref 120–384)

## 2021-09-08 LAB — FERRITIN: Ferritin: 9 ng/mL — ABNORMAL LOW (ref 11–307)

## 2021-09-08 MED ORDER — SODIUM CHLORIDE 0.9 % IV SOLN
200.0000 mg | Freq: Once | INTRAVENOUS | Status: AC
  Administered 2021-09-08: 200 mg via INTRAVENOUS
  Filled 2021-09-08: qty 200

## 2021-09-08 MED ORDER — CYANOCOBALAMIN 1000 MCG/ML IJ SOLN
1000.0000 ug | Freq: Once | INTRAMUSCULAR | Status: AC
  Administered 2021-09-08: 1000 ug via INTRAMUSCULAR
  Filled 2021-09-08: qty 1

## 2021-09-08 MED ORDER — SODIUM CHLORIDE 0.9 % IV SOLN: Freq: Once | INTRAVENOUS | Status: AC

## 2021-09-08 NOTE — Progress Notes (Signed)
Pt observed for 30 minutes post Venofer infusion.  Pt tolerated trtmt well without incident.  VSS at discharge.  Ambulatory to lobby.

## 2021-09-08 NOTE — Patient Instructions (Signed)
Iron Sucrose Injection ?What is this medication? ?IRON SUCROSE (EYE ern SOO krose) treats low levels of iron (iron deficiency anemia) in people with kidney disease. Iron is a mineral that plays an important role in making red blood cells, which carry oxygen from your lungs to the rest of your body. ?This medicine may be used for other purposes; ask your health care provider or pharmacist if you have questions. ?COMMON BRAND NAME(S): Venofer ?What should I tell my care team before I take this medication? ?They need to know if you have any of these conditions: ?Anemia not caused by low iron levels ?Heart disease ?High levels of iron in the blood ?Kidney disease ?Liver disease ?An unusual or allergic reaction to iron, other medications, foods, dyes, or preservatives ?Pregnant or trying to get pregnant ?Breast-feeding ?How should I use this medication? ?This medication is for infusion into a vein. It is given in a hospital or clinic setting. ?Talk to your care team about the use of this medication in children. While this medication may be prescribed for children as [place] as 2 years for selected conditions, precautions do apply. ?Overdosage: If you think you have taken too much of this medicine contact a poison control center or emergency room at once. ?NOTE: This medicine is only for you. Do not share this medicine with others. ?What if I miss a dose? ?It is important not to miss your dose. Call your care team if you are unable to keep an appointment. ?What may interact with this medication? ?Do not take this medication with any of the following: ?Deferoxamine ?Dimercaprol ?Other iron products ?This medication may also interact with the following: ?Chloramphenicol ?Deferasirox ?This list may not describe all possible interactions. Give your health care provider a list of all the medicines, herbs, non-prescription drugs, or dietary supplements you use. Also tell them if you smoke, drink alcohol, or use illegal drugs.  Some items may interact with your medicine. ?What should I watch for while using this medication? ?Visit your care team regularly. Tell your care team if your symptoms do not start to get better or if they get worse. You may need blood work done while you are taking this medication. ?You may need to follow a special diet. Talk to your care team. Foods that contain iron include: whole grains/cereals, dried fruits, beans, or peas, leafy green vegetables, and organ meats (liver, kidney). ?What side effects may I notice from receiving this medication? ?Side effects that you should report to your care team as soon as possible: ?Allergic reactions--skin rash, itching, hives, swelling of the face, lips, tongue, or throat ?Low blood pressure--dizziness, feeling faint or lightheaded, blurry vision ?Shortness of breath ?Side effects that usually do not require medical attention (report to your care team if they continue or are bothersome): ?Flushing ?Headache ?Joint pain ?Muscle pain ?Nausea ?Pain, redness, or irritation at injection site ?This list may not describe all possible side effects. Call your doctor for medical advice about side effects. You may report side effects to FDA at 1-800-FDA-1088. ?Where should I keep my medication? ?This medication is given in a hospital or clinic and will not be stored at home. ?NOTE: This sheet is a summary. It may not cover all possible information. If you have questions about this medicine, talk to your doctor, pharmacist, or health care provider. ?? 2022 Elsevier/Gold Standard (2021-03-10 00:00:00) ? ?Vitamin B12 Injection ?What is this medication? ?Vitamin B12 (VAHY tuh min B12) prevents and treats low vitamin B12 levels in your   body. It is used in people who do not get enough vitamin B12 from their diet or when their digestive tract does not absorb enough. Vitamin B12 plays an important role in maintaining the health of your nervous system and red blood cells. ?This medicine may be  used for other purposes; ask your health care provider or pharmacist if you have questions. ?COMMON BRAND NAME(S): B-12 Compliance Kit, B-12 Injection Kit, Cyomin, Dodex, LA-12, Nutri-Twelve, Physicians EZ Use B-12, Primabalt ?What should I tell my care team before I take this medication? ?They need to know if you have any of these conditions: ?Kidney disease ?Leber's disease ?Megaloblastic anemia ?An unusual or allergic reaction to cyanocobalamin, cobalt, other medications, foods, dyes, or preservatives ?Pregnant or trying to get pregnant ?Breast-feeding ?How should I use this medication? ?This medication is injected into a muscle or deeply under the skin. It is usually given in a clinic or care team's office. However, your care team may teach you how to inject yourself. Follow all instructions. ?Talk to your care team about the use of this medication in children. Special care may be needed. ?Overdosage: If you think you have taken too much of this medicine contact a poison control center or emergency room at once. ?NOTE: This medicine is only for you. Do not share this medicine with others. ?What if I miss a dose? ?If you are given your dose at a clinic or care team's office, call to reschedule your appointment. If you give your own injections, and you miss a dose, take it as soon as you can. If it is almost time for your next dose, take only that dose. Do not take double or extra doses. ?What may interact with this medication? ?Colchicine ?Heavy alcohol intake ?This list may not describe all possible interactions. Give your health care provider a list of all the medicines, herbs, non-prescription drugs, or dietary supplements you use. Also tell them if you smoke, drink alcohol, or use illegal drugs. Some items may interact with your medicine. ?What should I watch for while using this medication? ?Visit your care team regularly. You may need blood work done while you are taking this medication. ?You may need to  follow a special diet. Talk to your care team. Limit your alcohol intake and avoid smoking to get the best benefit. ?What side effects may I notice from receiving this medication? ?Side effects that you should report to your care team as soon as possible: ?Allergic reactions--skin rash, itching, hives, swelling of the face, lips, tongue, or throat ?Swelling of the ankles, hands, or feet ?Trouble breathing ?Side effects that usually do not require medical attention (report to your care team if they continue or are bothersome): ?Diarrhea ?This list may not describe all possible side effects. Call your doctor for medical advice about side effects. You may report side effects to FDA at 1-800-FDA-1088. ?Where should I keep my medication? ?Keep out of the reach of children. ?Store at room temperature between 15 and 30 degrees C (59 and 85 degrees F). Protect from light. Throw away any unused medication after the expiration date. ?NOTE: This sheet is a summary. It may not cover all possible information. If you have questions about this medicine, talk to your doctor, pharmacist, or health care provider. ?? 2022 Elsevier/Gold Standard (2020-12-28 00:00:00) ? ?

## 2021-10-06 ENCOUNTER — Other Ambulatory Visit: Payer: Self-pay | Admitting: Physician Assistant

## 2021-10-06 ENCOUNTER — Inpatient Hospital Stay: Payer: 59

## 2021-10-06 ENCOUNTER — Inpatient Hospital Stay: Payer: 59 | Attending: Physician Assistant

## 2021-10-06 ENCOUNTER — Other Ambulatory Visit: Payer: Self-pay

## 2021-10-06 VITALS — BP 108/79 | HR 91 | Temp 98.2°F | Resp 18

## 2021-10-06 DIAGNOSIS — Z86718 Personal history of other venous thrombosis and embolism: Secondary | ICD-10-CM | POA: Insufficient documentation

## 2021-10-06 DIAGNOSIS — D500 Iron deficiency anemia secondary to blood loss (chronic): Secondary | ICD-10-CM | POA: Insufficient documentation

## 2021-10-06 DIAGNOSIS — Z86711 Personal history of pulmonary embolism: Secondary | ICD-10-CM | POA: Diagnosis not present

## 2021-10-06 DIAGNOSIS — E538 Deficiency of other specified B group vitamins: Secondary | ICD-10-CM | POA: Insufficient documentation

## 2021-10-06 DIAGNOSIS — N920 Excessive and frequent menstruation with regular cycle: Secondary | ICD-10-CM | POA: Insufficient documentation

## 2021-10-06 LAB — IRON AND TIBC
Iron: 43 ug/dL (ref 41–142)
Saturation Ratios: 12 % — ABNORMAL LOW (ref 21–57)
TIBC: 364 ug/dL (ref 236–444)
UIBC: 321 ug/dL (ref 120–384)

## 2021-10-06 LAB — CBC WITH DIFFERENTIAL (CANCER CENTER ONLY)
Abs Immature Granulocytes: 0 10*3/uL (ref 0.00–0.07)
Basophils Absolute: 0 10*3/uL (ref 0.0–0.1)
Basophils Relative: 1 %
Eosinophils Absolute: 0.2 10*3/uL (ref 0.0–0.5)
Eosinophils Relative: 4 %
HCT: 40.5 % (ref 36.0–46.0)
Hemoglobin: 12.8 g/dL (ref 12.0–15.0)
Immature Granulocytes: 0 %
Lymphocytes Relative: 38 %
Lymphs Abs: 1.4 10*3/uL (ref 0.7–4.0)
MCH: 26.9 pg (ref 26.0–34.0)
MCHC: 31.6 g/dL (ref 30.0–36.0)
MCV: 85.1 fL (ref 80.0–100.0)
Monocytes Absolute: 0.3 10*3/uL (ref 0.1–1.0)
Monocytes Relative: 8 %
Neutro Abs: 1.8 10*3/uL (ref 1.7–7.7)
Neutrophils Relative %: 49 %
Platelet Count: 253 10*3/uL (ref 150–400)
RBC: 4.76 MIL/uL (ref 3.87–5.11)
RDW: 14.6 % (ref 11.5–15.5)
WBC Count: 3.7 10*3/uL — ABNORMAL LOW (ref 4.0–10.5)
nRBC: 0 % (ref 0.0–0.2)

## 2021-10-06 LAB — FERRITIN: Ferritin: 24 ng/mL (ref 11–307)

## 2021-10-06 MED ORDER — CYANOCOBALAMIN 1000 MCG/ML IJ SOLN
1000.0000 ug | Freq: Once | INTRAMUSCULAR | Status: AC
  Administered 2021-10-06: 1000 ug via INTRAMUSCULAR
  Filled 2021-10-06: qty 1

## 2021-10-06 MED ORDER — SODIUM CHLORIDE 0.9 % IV SOLN
200.0000 mg | Freq: Once | INTRAVENOUS | Status: AC
  Administered 2021-10-06: 200 mg via INTRAVENOUS
  Filled 2021-10-06: qty 200

## 2021-10-06 MED ORDER — SODIUM CHLORIDE 0.9 % IV SOLN: INTRAVENOUS | Status: DC

## 2021-10-06 NOTE — Patient Instructions (Signed)

## 2021-10-29 ENCOUNTER — Encounter: Payer: Self-pay | Admitting: Physician Assistant

## 2021-10-31 ENCOUNTER — Encounter: Payer: Self-pay | Admitting: Physician Assistant

## 2021-10-31 ENCOUNTER — Emergency Department (HOSPITAL_BASED_OUTPATIENT_CLINIC_OR_DEPARTMENT_OTHER): Payer: Managed Care, Other (non HMO)

## 2021-10-31 ENCOUNTER — Other Ambulatory Visit: Payer: Self-pay

## 2021-10-31 ENCOUNTER — Encounter (HOSPITAL_BASED_OUTPATIENT_CLINIC_OR_DEPARTMENT_OTHER): Payer: Self-pay | Admitting: Emergency Medicine

## 2021-10-31 ENCOUNTER — Emergency Department (HOSPITAL_BASED_OUTPATIENT_CLINIC_OR_DEPARTMENT_OTHER): Payer: Managed Care, Other (non HMO) | Admitting: Radiology

## 2021-10-31 ENCOUNTER — Emergency Department (HOSPITAL_BASED_OUTPATIENT_CLINIC_OR_DEPARTMENT_OTHER)
Admission: EM | Admit: 2021-10-31 | Discharge: 2021-10-31 | Disposition: A | Discharge status: Home / Self Care | Payer: Managed Care, Other (non HMO) | Attending: Emergency Medicine | Admitting: Emergency Medicine

## 2021-10-31 DIAGNOSIS — R0609 Other forms of dyspnea: Secondary | ICD-10-CM

## 2021-10-31 DIAGNOSIS — M79662 Pain in left lower leg: Secondary | ICD-10-CM | POA: Diagnosis not present

## 2021-10-31 DIAGNOSIS — M79661 Pain in right lower leg: Secondary | ICD-10-CM | POA: Insufficient documentation

## 2021-10-31 DIAGNOSIS — R0600 Dyspnea, unspecified: Secondary | ICD-10-CM | POA: Insufficient documentation

## 2021-10-31 DIAGNOSIS — Z20822 Contact with and (suspected) exposure to covid-19: Secondary | ICD-10-CM | POA: Diagnosis not present

## 2021-10-31 DIAGNOSIS — Z91040 Latex allergy status: Secondary | ICD-10-CM | POA: Diagnosis not present

## 2021-10-31 DIAGNOSIS — R0602 Shortness of breath: Secondary | ICD-10-CM | POA: Diagnosis present

## 2021-10-31 LAB — CBC
HCT: 45.2 % (ref 36.0–46.0)
Hemoglobin: 14.3 g/dL (ref 12.0–15.0)
MCH: 26.9 pg (ref 26.0–34.0)
MCHC: 31.6 g/dL (ref 30.0–36.0)
MCV: 85.1 fL (ref 80.0–100.0)
Platelets: 287 10*3/uL (ref 150–400)
RBC: 5.31 MIL/uL — ABNORMAL HIGH (ref 3.87–5.11)
RDW: 15.5 % (ref 11.5–15.5)
WBC: 3.8 10*3/uL — ABNORMAL LOW (ref 4.0–10.5)
nRBC: 0 % (ref 0.0–0.2)

## 2021-10-31 LAB — PREGNANCY, URINE: Preg Test, Ur: NEGATIVE

## 2021-10-31 LAB — BASIC METABOLIC PANEL
Anion gap: 9 (ref 5–15)
BUN: 9 mg/dL (ref 6–20)
CO2: 23 mmol/L (ref 22–32)
Calcium: 9.6 mg/dL (ref 8.9–10.3)
Chloride: 102 mmol/L (ref 98–111)
Creatinine, Ser: 0.93 mg/dL (ref 0.44–1.00)
GFR, Estimated: >60 mL/min (ref >60)
Glucose, Bld: 81 mg/dL (ref 70–99)
Potassium: 3.7 mmol/L (ref 3.5–5.1)
Sodium: 134 mmol/L — ABNORMAL LOW (ref 135–145)

## 2021-10-31 LAB — TROPONIN I (HIGH SENSITIVITY): Troponin I (High Sensitivity): <2 ng/L (ref <18)

## 2021-10-31 LAB — RESP PANEL BY RT-PCR (FLU A&B, COVID) ARPGX2
Influenza A by PCR: NEGATIVE
Influenza B by PCR: NEGATIVE
SARS Coronavirus 2 by RT PCR: NEGATIVE

## 2021-10-31 LAB — BRAIN NATRIURETIC PEPTIDE: B Natriuretic Peptide: 61.1 pg/mL (ref 0.0–100.0)

## 2021-10-31 MED ORDER — IOHEXOL 350 MG/ML SOLN
85.0000 mL | Freq: Once | INTRAVENOUS | Status: AC | PRN
  Administered 2021-10-31: 85 mL via INTRAVENOUS

## 2021-10-31 NOTE — ED Provider Notes (Signed)
[facility]GENC[facility] DEPT Provider Note   CSN: [id number] Arrival date & time: 10/31/21  0854     History  Chief Complaint  Patient presents with   Shortness of Breath    Nina Morrow is a 31 y.o. female.  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    Patient reports that over the last few days she has been having shortness of breath and also some calf pain.  She has been taking her Xarelto as prescribed for the last several years now.  The calf pain is intermittent and bilateral.  She has some chest tightness as well.  Her symptoms are concerning to her for PE.  She denies any nausea, vomiting, fevers, chills.  She did have some congestion about a week ago but that has resolved.  Patient has history of anxiety, but does not think that the symptoms are because of it.   Home Medications Prior to Admission medications   Medication Sig Start Date End Date Taking? Authorizing Provider  albuterol (VENTOLIN HFA) 108 (90 Base) MCG/ACT inhaler Inhale 2 puffs into the lungs every 6 (six) hours as needed for wheezing or shortness of breath. prn 12/24/18   [provider]  buPROPion (WELLBUTRIN XL) 150 MG 24 hr tablet Take 150 mg by mouth daily. 08/02/21   [provider]  Magnesium Oxide 200 MG TABS Take 200 mg by mouth daily. Taking 1/2 tab    [provider]  norethindrone (MICRONOR) 0.35 MG tablet Take 1 tablet by mouth daily. 05/06/21   [provider]  Oxcarbazepine (TRILEPTAL) 300 MG tablet Take 300 mg by mouth daily as needed. 10/05/21   [provider]  rivaroxaban (XARELTO) 20 MG TABS tablet Take 1 tablet (20 mg total) by mouth daily with supper. 07/20/21   [name], PA-C  topiramate (TOPAMAX) 100 MG tablet Take 100 mg by mouth daily. 08/05/21   [provider]  traZODone  (DESYREL) 100 MG tablet Take 100 mg by mouth at bedtime. 08/02/21   [provider]      Allergies    Aripiprazole, Grass extracts [gramineae pollens], and Latex    Review of Systems   Review of Systems  Constitutional:  Positive for activity change.  Respiratory:  Positive for cough and shortness of breath.   Cardiovascular:  Positive for chest pain.  Gastrointestinal:  Negative for nausea and vomiting.   Physical Exam Updated Vital Signs BP 132/83    Pulse 77    Temp 98.6 F (37 C)    Resp 20    Ht 6' (1.829 m)    Wt 132 kg    SpO2 100%    BMI 39.47 kg/m  Physical Exam Vitals and nursing note reviewed.  Constitutional:      Appearance: She is well-developed.  HENT:     Head: Atraumatic.  Cardiovascular:     Rate and Rhythm: Normal rate.  Pulmonary:     Effort: Pulmonary effort is normal.     Breath sounds: No decreased breath sounds, wheezing, rhonchi or rales.  Musculoskeletal:     Cervical back: Normal range of motion and neck supple.  Skin:    General: Skin is warm and dry.  Neurological:     Mental Status: She is alert and oriented to person, place, and time.    ED Results / Procedures /  Treatments   Labs (all labs ordered are listed, but only abnormal results are displayed) Labs Reviewed  BASIC METABOLIC PANEL - Abnormal; Notable for the following components:      Result Value   Sodium 134 (*)    All other components within normal limits  CBC - Abnormal; Notable for the following components:   WBC 3.8 (*)    RBC 5.31 (*)    All other components within normal limits  RESP PANEL BY RT-PCR (FLU A&B, COVID) ARPGX2  PREGNANCY, URINE  BRAIN NATRIURETIC PEPTIDE  TROPONIN I (HIGH SENSITIVITY)  TROPONIN I (HIGH SENSITIVITY)    EKG EKG Interpretation  Date/Time:  Tuesday October 31 2021 09:29:06 EST Ventricular Rate:  114 PR Interval:  158 QRS Duration: 82 QT Interval:  322 QTC Calculation: 443 R Axis:   82 Text Interpretation: Sinus tachycardia  Right atrial enlargement Borderline ECG When compared with ECG of 22-Nov-2020 22:43, Premature atrial complexes are no longer Present Vent. rate has increased BY  63 BPM Nonspecific T wave abnormality now evident in Lateral leads Confirmed by Varney Biles [phone]) on 10/31/2021 11:45:22 AM  Radiology DG Chest 2 View  Result Date: 10/31/2021 CLINICAL DATA:  Chest tightness EXAM: CHEST - 2 VIEW COMPARISON:  11/22/2020 FINDINGS: The heart size and mediastinal contours are within normal limits. Both lungs are clear. The visualized skeletal structures are unremarkable. IMPRESSION: No active cardiopulmonary disease. Electronically Signed   By: Franchot Gallo M.D.   On: 10/31/2021 09:48   CT Angio Chest PE W and/or Wo Contrast  Result Date: 10/31/2021 CLINICAL DATA:  Shortness of breath EXAM: CT ANGIOGRAPHY CHEST WITH CONTRAST TECHNIQUE: Multidetector CT imaging of the chest was performed using the standard protocol during bolus administration of intravenous contrast. Multiplanar CT image reconstructions and MIPs were obtained to evaluate the vascular anatomy. CONTRAST:  54mL OMNIPAQUE IOHEXOL 350 MG/ML SOLN COMPARISON:  Chest radiograph performed on the same date FINDINGS: Cardiovascular: Satisfactory opacification of the pulmonary arteries to the segmental level. No evidence of pulmonary embolism. Normal heart size. No pericardial effusion. Mediastinum/Nodes: No enlarged mediastinal, hilar, or axillary lymph nodes. Thyroid gland, trachea, and esophagus demonstrate no significant findings. Lungs/Pleura: Lungs are clear. No pleural effusion or pneumothorax. Upper Abdomen: Postsurgical changes about the stomach, likely representing prior sleeve gastrectomy. No acute abnormality. Musculoskeletal: No chest wall abnormality. No acute or significant osseous findings. Review of the MIP images confirms the above findings. IMPRESSION: 1. No evidence of pulmonary embolism or acute cardiopulmonary process. 2. Postsurgical  changes for prior sleeve gastrectomy, no acute upper abdominal process. Electronically Signed   By: Keane Police D.O.   On: 10/31/2021 13:15    Procedures Procedures    Medications Ordered in ED Medications  iohexol (OMNIPAQUE) 350 MG/ML injection 85 mL (85 mLs Intravenous Contrast Given 10/31/21 1302)    ED Course/ Medical Decision Making/ A&P                           Medical Decision Making Amount and/or Complexity of Data Reviewed Independent Historian: parent External Data Reviewed: ECG. Labs: ordered. Decision-making details documented in ED Course. Radiology: ordered and independent interpretation performed. Decision-making details documented in ED Course. ECG/medicine tests: ordered and independent interpretation performed.  Risk OTC drugs.   31 year old female comes in with chief complaint of shortness of breath. She also reports that her heart rate has been up and down when she has done pulse check at home.  At arrival she  was tachycardic, but her heart rate improved on its own.  I gather that it could be a level of anxiety that is driving her symptoms.  Patient has history of anxiety disorder, but wants to make sure that her symptoms are not because of PE which she has history of.  Patient has history of protein as deficiencY ?  That led to PE and DVT in the past.  Her main concern is PE at this time given that she is having some shortness of breath.  Her exam is reassuring.  It does not appear that she has COVID-19.  We discussed with the patient and the benefits and harms of proceeding with a CT scan in the setting of her taking Xarelto regularly.  Patient prefers the aggressive option of getting a CT scan while she is here rather than waiting to see if the symptoms evolve.  CT scan is negative for any PE.  I have independently reviewed the CT scan for blood clot itself, and agree with the radiologist interpretation  Her BNP is negative for any evidence of  CHF.  Although patient had not made me aware, her CT scan did reveal some evidence of gastrectomy.  Perhaps some of her symptoms could be driven by esophagitis.  We will advise her to follow-up with her PCP, ensure that she is taking PPI if she is not on it.  The patient appears reasonably screened and/or stabilized for discharge and I doubt any other medical condition or other [facility] requiring further screening, evaluation, or treatment in the ED at this time prior to discharge.   Results from the ER workup discussed with the patient face to face and all questions answered to the best of my ability. The patient is safe for discharge with strict return precautions.         Final Clinical Impression(s) / ED Diagnoses Final diagnoses:  Dyspnea on exertion    Rx / DC Orders ED Discharge Orders     None         Varney Biles, MD 10/31/21 1348

## 2021-10-31 NOTE — ED Triage Notes (Signed)
Pt arrives to ED with c/o shortness of breath. SOB has been constant for 4-5 days. Associated symptoms include chest tightness, bilateral calf pain. Hx DVT, PE.

## 2021-10-31 NOTE — Discharge Instructions (Signed)
We saw you in the ER for the chest pain/shortness of breath. All of our cardiac workup is normal, including labs, EKG and chest X-RAY are normal. CT scan does not show any evidence of blood clot. Cardiac enzymes and heart failure screening test are also normal.  We are not sure what is causing your discomfort, but we feel comfortable sending you home at this time. The workup in the ER is not complete, and you should follow up with your primary care doctor for further evaluation.;R

## 2021-11-01 NOTE — H&P (Signed)
Nina Morrow is an 31 y.o. 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  In regards to her abnormal uterine bleeding, she reports that her periods are regular, last 8-9 days with heaviest bleeding occurring x 2-3 days. She has previously tried Nuvaring, OCPs, and Nexplanon in the past. Has not been on any estrogen-containing contraceptives due to hx VTE. She continued to have persistent bleeding with Mirena IUD in place. As documented below, patient has a TVUS 01/03/21 to evaluate her AUB and it was noted that the IUD was within the lower uterine segment/cervix. Mirena IUD was removed/replaced on 01/09/2021. IUD strings were visualized at the time of her colposcopy visit on 01/25/2021 for abnormal pap smear (see below). Patient was re-evaluated on 02/15/21 due to [place] IUD having expelled on 02/12/21 associated with heavy bleeding. At the time of her evaluation, she had re-started Xarelto for history of VTE. She was started on Norethindrone to aid with bleeding.  Patient completed Hysteroscopy D&C, Myosure Polypectomy and Cold Knife Conization on 07/11/2021. 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 The endometrium was benign and revealed a benign endometrial polyp. Patient was previously scheduled for CKC 03/22/21; however, she endorsed symptoms of SOB and chest pain at her hospital pre-operative assessment and her surgery was cancelled. She was recommended by anesthesia to wear a holter monitor for at least 1 month prior to her procedure. Patient follows with [facility]. 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 She also  reports episodes of hypotension and syncope. Ultimately, patient has been cleared by Cardiology to proceed with surgery.   Patient has also mentioned RLQ pelvic pain and dyspareunia that has worsened since 2020. Ultrasounds have been unremarkable and no source of pain has been identified. Initially, she was planned to undergo diagnostic laparoscopy as well; however, given patient's ultimate desire for definitive surgical management via hysterectomy, she opted to forego this procedure.   Patient has endorsed that she very strongly does not desire future fertility. She desires definitive surgical management of her bleeding. She understands that this will result in sterility.    Patient has undergone GI work-up about 10 years ago. Reports having had a previously normal colonoscopy and EGD revealed ulcers.   Cardiology Clearance/Recommendations: "From cardiac standpoint, I would avoid BP and HR fluctuations during surgery. I would keep K+ 4.0-5.0 range and Magnesium 2.0-2.3 range" (Dr. Marina Goodell with [facility])   Hematology/Oncology Clearance/Recommendations:  Hold Xarelto 48 hours prior to surgery and resume once hemostasis is achieved [name], PA-C with [facility])   Current anticoagulation: Xarelto 20mg  qD  TVUS (04/17/21): Uterus 8.40 x 3.75 x 4.80cm Endometrial thickness 0.78cm R ovary 3.34cm   L ovary 3.25cm Anteverted uterus. No uterine anomalies seen. Endometrium thickened and trilayered - no discrete masses seen, no blood flow noted. Bilateral ovaries wnl. No adnexal masses seen.   TVUS (01/03/2021): Uterus 8.79 x 4.04 x 4.87cm Endometrial thickness 1.03cm R ovary 3.41cm   L ovary 2.69cm Anteverted uterus. No uterine anomalies seen. Endometrium thickened - no blood flow noted. IUD seen within lower uterine segment/cervix. R ovary - simple follicle 1.5 x 1.4 x 4.2VZ, avascular. Left ovary wnl. No adnexal masses seen. Free fluid seen in cul-de-sac.  Patient Active Problem List   Diagnosis Date Noted   Bipolar 2 disorder, major depressive episode ([place]) 05/26/2021   Vitamin B12 deficiency 05/12/2021   Iron deficiency anemia 02/03/2021   History of DVT (deep vein thrombosis) 02/03/2021   Personal history of venous thrombosis and embolism 02/03/2021    MEDICAL/FAMILY/SOCIAL HX: No LMP recorded.    Past Medical History:  Diagnosis Date   Anemia    Anxiety    Bipolar 1 disorder ([place])    with depression and anxiety. stopped taking lithium december 2021.    Cancer ([place])    Cervical   Depression    DVT (deep venous thrombosis) ([facility])    GERD (gastroesophageal reflux disease)    Hallucination    Headache    Palpitation    PCOS (polycystic ovarian syndrome)    Pneumonia    Pulmonary emboli ([facility])    Syncope and collapse     Past Surgical History:  Procedure Laterality Date   BARIATRIC SURGERY     CERVICAL CONIZATION W/BX N/A 07/11/2021   Procedure: CONIZATION CERVIX WITH BIOPSY;  Surgeon: Drema Dallas, DO;  Location: [facility];  Service: Gynecology;  Laterality: N/A;   HYSTEROSCOPY WITH D & C N/A 07/11/2021   Procedure: DILATATION AND CURETTAGE /HYSTEROSCOPY WITH MYOSURE;  Surgeon: Drema Dallas, DO;  Location: [facility];  Service: Gynecology;  Laterality: N/A;   TONSILLECTOMY     TONSILLECTOMY AND ADENOIDECTOMY      Family History  Problem Relation Age of Onset   Transient ischemic attack Mother        76   Pulmonary embolism Sister        May 2021   Breast cancer Maternal Grandmother    Stomach cancer Maternal Grandfather    Lung cancer Maternal Grandfather    Breast cancer Cousin     Social History:  reports that she quit smoking about 9 years ago. Her smoking use included e-cigarettes, cigarettes, and cigars. She has never used smokeless tobacco. She reports current alcohol use. She reports that she does not currently use drugs after having used the following drugs: Marijuana.  ALLERGIES/MEDS:  Allergies:   Allergies  Allergen Reactions   Aripiprazole Other (See Comments)    Light headed, SOB, diarrhea, vomiting   Grass Extracts [Gramineae Pollens] Other (See Comments)    Coughing, sneezing   Latex Hives and Rash    No medications prior to admission.     Review of Systems  Constitutional: Negative.   HENT: Negative.    Eyes: Negative.   Respiratory: Negative.    Cardiovascular: Negative.   Gastrointestinal: Negative.   Genitourinary: Negative.   Musculoskeletal: Negative.   Skin: Negative.   Neurological: Negative.   Endo/Heme/Allergies: Negative.   Psychiatric/Behavioral: Negative.     There were no vitals taken for this visit. Gen:  NAD, pleasant and cooperative Cardio:  RRR Pulm:  CTAB, no wheezes/rales/rhonchi Abd:  Soft, non-distended, non-tender throughout, no rebound/guarding Ext:  No bilateral LE edema, no bilateral calf tenderness Pelvic: Labia - unremarkable, vagina - pink moist mucosa, no lesions or abnormal discharge, cervix - no discharge or lesions or CMT, uterus normal in size and nontender  No results found for this or any previous visit (from the past 24 hour(s)).  DG Chest 2 View  Result Date: 10/31/2021 CLINICAL DATA:  Chest tightness EXAM: CHEST - 2 VIEW COMPARISON:  11/22/2020 FINDINGS: The heart size and mediastinal contours are within normal limits. Both lungs are clear. The visualized skeletal structures are unremarkable.  IMPRESSION: No active cardiopulmonary disease. Electronically Signed   By: Franchot Gallo M.D.   On: 10/31/2021 09:48   CT Angio Chest PE W and/or Wo Contrast  Result Date: 10/31/2021 CLINICAL DATA:  Shortness of breath EXAM: CT ANGIOGRAPHY CHEST WITH CONTRAST TECHNIQUE: Multidetector CT imaging of the chest was performed using the standard protocol during bolus administration of intravenous contrast. Multiplanar CT image reconstructions and MIPs were obtained to evaluate the vascular anatomy. CONTRAST:  3mL OMNIPAQUE IOHEXOL 350  MG/ML SOLN COMPARISON:  Chest radiograph performed on the same date FINDINGS: Cardiovascular: Satisfactory opacification of the pulmonary arteries to the segmental level. No evidence of pulmonary embolism. Normal heart size. No pericardial effusion. Mediastinum/Nodes: No enlarged mediastinal, hilar, or axillary lymph nodes. Thyroid gland, trachea, and esophagus demonstrate no significant findings. Lungs/Pleura: Lungs are clear. No pleural effusion or pneumothorax. Upper Abdomen: Postsurgical changes about the stomach, likely representing prior sleeve gastrectomy. No acute abnormality. Musculoskeletal: No chest wall abnormality. No acute or significant osseous findings. Review of the MIP images confirms the above findings. IMPRESSION: 1. No evidence of pulmonary embolism or acute cardiopulmonary process. 2. Postsurgical changes for prior sleeve gastrectomy, no acute upper abdominal process. Electronically Signed   By: Keane Police D.O.   On: 10/31/2021 13:15     ASSESSMENT/PLAN: Nina Morrow is a 31 y.o. 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  - Admit to [facility] OR - Admit labs (CBC, CMP, T&S, COVID screen) - Diet:  NPO - IVF:  Per anesthesia - VTE Prophylaxis:  SCDs, Hold Xarelto x 48h prior to surgery and will restart POD#1 - Antibiotics: Ancef 2g on call to OR - Anticipate D/C home POD#1  Consents: I have explained to the patient that his surgery is performed to remove the uterus through several small incisions in the abdomen and that it will result in sterility.  I discussed the risks and benefits of the surgery, including, but not limited to bleeding, including the need for blood transfusion, infection, damage to surround organs and tissues, damage to bladder, damage to ureters, causing  kidney damage, and requiring additional procedures, damage to bowels, resulting in further surgery, postoperative pain, short-term and long-term, scarring on the abdominal wall and intra-abdominally, need for further surgery, need for conversion to an open procedure, development of an incisional hernia, deep vein thrombosis, and/or pulmonary embolism, wound infection and/or separation, painful intercourse, urinary leakage, ovarian failure, resulting in menopausal symptoms requiring treatment, fistula formation, complications the course of which cannot be predicted or prevented, and death. Patient was counseled on prophylactic oophorectomy versus ovarian conservation, understanding that ovarian conservation carries a lifetime risk of ovarian cancer of 1 in 21 and a 5-10% risk of reoperation in the future for ovarian pathology.  She understands that prophylactic oophorectomy may result in menopausal symptoms. We discussed that given her age, benefits would outweigh risks in oophorectomy. Patient desires ovarian preservation. Patient was consented for blood products.  The patient is aware that bleeding may result in the need for a blood transfusion which includes risk of transmission of HIV (1:2 million), Hepatitis C (1:2 million), and Hepatitis B (1:200 thousand) and transfusion reaction.  Patient voiced understanding of the above risks as well as understanding of indications for blood transfusion.  Drema Dallas, DO

## 2021-11-02 ENCOUNTER — Other Ambulatory Visit: Payer: Self-pay | Admitting: Physician Assistant

## 2021-11-02 ENCOUNTER — Encounter: Payer: Self-pay | Admitting: Physician Assistant

## 2021-11-02 ENCOUNTER — Other Ambulatory Visit: Payer: Self-pay | Admitting: *Deleted

## 2021-11-02 DIAGNOSIS — E538 Deficiency of other specified B group vitamins: Secondary | ICD-10-CM

## 2021-11-02 DIAGNOSIS — D500 Iron deficiency anemia secondary to blood loss (chronic): Secondary | ICD-10-CM

## 2021-11-02 MED ORDER — RIVAROXABAN 20 MG PO TABS: 20.0000 mg | ORAL_TABLET | Freq: Every day | ORAL | 3 refills | Status: DC

## 2021-11-02 NOTE — Progress Notes (Unsigned)
[place] [phone]   Fax:([phone]) [place] NOTE  Patient Care Team: Trey Sailors, PA as PCP - General (Physician Assistant)  Hematological/Oncological History #Recurrent DVTs/PE: 1) 06/2010: Right leg DVT and PE treated with warfarin x 3 months-felt to be provoked by OCPs 2) 2015: Left Leg DVT. Felt to be provoked by OCPs. Recommended indefinite AC and treated with warfarin. Patient self-discontinued after 68months 3) 12/2017: Right leg DVT and PE treated with warfarin-no provoking factor since not on OCPS.  4) 03/14/2020: Checked Factor V Leiden mutation, Prothrombin Mutation, Lupus anticoagulant-Not present 5) 03/30/2020: Switch to Eliquis.  6) 10/2020: Discontinued Eliquis due to cost.  7) 02/02/2021: Establish care with [name] PA-C. Recommend indefinite anticoagulation with Xarelto.   #Iron deficiency anemia: 1) 01/20/2021: Iron panel from [facility] revealed IDA with total iron 36, iron saturation 8%, ferritin 4.  2) 02/17/2021-04/14/2021: Received IV venofer x 5 doses.  3) 05/30/2021-08/11/2021: Received IV venofer x 3 doses 4) 09/08/2021-10/06/2021: Received IV venofer x 2 doses  #Vitamin B12 deficiency: 1)05/30/2021-Present: Started once monthly vitamin B12 injections  CHIEF COMPLAINT: -History of multiple DVT and PE -Iron deficiency anemia  -Vitamin B12 deficiency  HISTORY OF PRESENTING ILLNESS:  Nina Morrow 31 y.o. female who returns for a follow up for history of DVTs/PE while on Xarelto, iron deficiency and vitamin B12 deficiency. Since the last visit on 10/14//2022, patient received IV venofer x 2 doses. ***    MEDICAL HISTORY:  Past Medical History:  Diagnosis Date   Anemia    Anxiety    Bipolar 1 disorder ([place])    with depression and anxiety. stopped taking lithium december 2021.    Cancer ([place])    Cervical   Depression    DVT (deep venous thrombosis) (HCC)    GERD (gastroesophageal reflux  disease)    Hallucination    Headache    Palpitation    PCOS (polycystic ovarian syndrome)    Pneumonia    Pulmonary emboli (HCC)    Syncope and collapse     SURGICAL HISTORY: Past Surgical History:  Procedure Laterality Date   BARIATRIC SURGERY     CERVICAL CONIZATION W/BX N/A 07/11/2021   Procedure: CONIZATION CERVIX WITH BIOPSY;  Surgeon: Drema Dallas, DO;  Location: [facility];  Service: Gynecology;  Laterality: N/A;   HYSTEROSCOPY WITH D & C N/A 07/11/2021   Procedure: DILATATION AND CURETTAGE /HYSTEROSCOPY WITH MYOSURE;  Surgeon: Drema Dallas, DO;  Location: [facility];  Service: Gynecology;  Laterality: N/A;   TONSILLECTOMY     TONSILLECTOMY AND ADENOIDECTOMY      SOCIAL HISTORY: Social History   Socioeconomic History   Marital status: Significant Other    Spouse name: Not on file   Number of children: Not on file   Years of education: Not on file   Highest education level: Not on file  Occupational History   Not on file  Tobacco Use   Smoking status: Former    Years: 2.00    Types: E-cigarettes, Cigarettes, Cigars    Quit date: 02/04/2012    Years since quitting: 9.7   Smokeless tobacco: Never   Tobacco comments:    Used to smoke 1 pack a month  Vaping Use   Vaping Use: Former  Substance and Sexual Activity   Alcohol use: Yes    Comment: socially. 1-2 drinks/month   Drug use: Not Currently    Types: Marijuana    Comment: vapes TH; occasional mushrooms   Sexual  activity: Not on file  Other Topics Concern   Not on file  Social History Narrative   Not on file   Social Determinants of Health   Financial Resource Strain: Not on file  Food Insecurity: Not on file  Transportation Needs: Not on file  Physical Activity: Not on file  Stress: Not on file  Social Connections: Not on file  Intimate Partner Violence: Not on file    FAMILY HISTORY: Family History  Problem Relation Age of Onset   Transient ischemic attack Mother        2015   Pulmonary embolism  Sister        May 2021   Breast cancer Maternal Grandmother    Stomach cancer Maternal Grandfather    Lung cancer Maternal Grandfather    Breast cancer Cousin     ALLERGIES:  is allergic to aripiprazole, grass extracts [gramineae pollens], and latex.  MEDICATIONS:  Current Outpatient Medications  Medication Sig Dispense Refill   albuterol (VENTOLIN HFA) 108 (90 Base) MCG/ACT inhaler Inhale 2 puffs into the lungs every 6 (six) hours as needed for wheezing or shortness of breath. prn     buPROPion (WELLBUTRIN XL) 150 MG 24 hr tablet Take 150 mg by mouth daily.     Magnesium Oxide 200 MG TABS Take 200 mg by mouth daily. Taking 1/2 tab     norethindrone (MICRONOR) 0.35 MG tablet Take 1 tablet by mouth daily.     Oxcarbazepine (TRILEPTAL) 300 MG tablet Take 300 mg by mouth daily as needed.     rivaroxaban (XARELTO) 20 MG TABS tablet Take 1 tablet (20 mg total) by mouth daily with supper. 30 tablet 3   topiramate (TOPAMAX) 100 MG tablet Take 100 mg by mouth daily.     traZODone (DESYREL) 100 MG tablet Take 100 mg by mouth at bedtime.     No current facility-administered medications for this visit.    REVIEW OF SYSTEMS:   Constitutional: ( - ) fevers, ( - )  chills , (  ) night sweats Eyes: ( - ) blurriness of vision, ( - ) double vision, ( - ) watery eyes Ears, nose, mouth, throat, and face: ( - ) mucositis, ( - ) sore throat Respiratory: ( - ) cough, ( + ) dyspnea, ( - ) wheezes Cardiovascular: ( - ) palpitation, ( - ) chest discomfort, ( - ) lower extremity swelling Gastrointestinal:  (- ) nausea, ( - ) heartburn, ( - ) change in bowel habits Skin: ( - ) abnormal skin rashes Lymphatics: ( - ) new lymphadenopathy, ( - ) easy bruising Neurological: ( - ) numbness, ( - ) tingling, ( - ) new weaknesses Behavioral/Psych: ( - ) mood change, ( - ) new changes  All other systems were reviewed with the patient and are negative.  PHYSICAL EXAMINATION: ECOG PERFORMANCE STATUS: 1 - Symptomatic  but completely ambulatory  There were no vitals filed for this visit.  There were no vitals filed for this visit.   GENERAL: well appearing african [place] female in NAD  SKIN: skin color, texture, turgor are normal, no rashes or significant lesions EYES: conjunctiva are pink and non-injected, sclera clear OROPHARYNX: no exudate, no erythema; lips, buccal mucosa, and tongue normal  NECK: supple, non-tender LYMPH:  no palpable lymphadenopathy in the cervical, axillary or supraclavicular lymph nodes.  LUNGS: clear to auscultation and percussion with normal breathing effort HEART: regular rate & rhythm and no murmurs and no lower extremity edema ABDOMEN: soft,  non-tender, non-distended, normal bowel sounds Musculoskeletal: no cyanosis of digits and no clubbing  PSYCH: alert & oriented x 3, fluent speech NEURO: no focal motor/sensory deficits  LABORATORY DATA:  I have reviewed the data as listed CBC Latest Ref Rng & Units 10/31/2021 10/06/2021 09/08/2021  WBC 4.0 - 10.5 K/uL 3.8(L) 3.7(L) 4.2  Hemoglobin 12.0 - 15.0 g/dL 14.3 12.8 13.0  Hematocrit 36.0 - 46.0 % 45.2 40.5 41.1  Platelets 150 - 400 K/uL 287 253 271    CMP Latest Ref Rng & Units 10/31/2021 06/26/2021 05/26/2021  Glucose 70 - 99 mg/dL 81 89 88  BUN 6 - 20 mg/dL 9 9 6   Creatinine 0.44 - 1.00 mg/dL 0.93 0.83 0.76  Sodium 135 - 145 mmol/L 134(L) 141 137  Potassium 3.5 - 5.1 mmol/L 3.7 3.9 4.1  Chloride 98 - 111 mmol/L 102 108 103  CO2 22 - 32 mmol/L 23 27 26   Calcium 8.9 - 10.3 mg/dL 9.6 8.4(L) 9.4  Total Protein 6.5 - 8.1 g/dL - 6.7 7.3  Total Bilirubin 0.3 - 1.2 mg/dL - 0.2(L) 0.8  Alkaline Phos 38 - 126 U/L - 61 68  AST 15 - 41 U/L - 17 21  ALT 0 - 44 U/L - 18 19   ASSESSMENT & PLAN Nina Morrow is a 31 y.o. female returns for a follow up for history of recurrent DVT/PE along with vitamin B12 and iron deficiency anemia.   #Multiple DVT's and PE's: --Hypercoagulable workup was completed by [facility]  in May 2021 which included Factor V Leiden mutation, Prothrombin Mutation, Lupus anticoagulant, all three were unremarkable.  -Remaining hypercoagulable workup was done here on 02/03/2021 checking protein C and protein S activity, beta-2 glycoprotein antibody and cardiolipin antibody. Findings were unremarkable except for mild Protein S activity.  --Recommend indefinite anticoagulation due to recurrent DVT/PEs.  --Currently on Xarelto with good tolerance.   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  --Patient recently currently on progesterone containing birth control pills.   --Patient underwent Cold Knife Conization of the cervix on 07/11/2021 and scheduled to undergo hysterectomy on 11/15/2021.  --Received IV venofer x 9 doses since 02/17/2021.  --Labs today show *** --Patient is scheduled for hysterectomy on 11/15/2021.   #B12 deficiency: --Labs today show vitamin B12  levels *** --Secondary to gastric sleeve surgery.  --Recommend to continue IM B12 injection once a month.   #Weight gain: --Patient is frustrated with her weight gain and suspects it is a side effect from her medication.  --She is s/p bariatric surgery. I will request a follow up with her weight management team at [facility]. ***   Follow up: --Labs followed by IV venofer and B12 injections in 4 weeks and 8 weeks --RTC in 12 weeks with labs.    Orders Placed This Encounter  Procedures   CBC with Differential ([place] Only)    Standing Status:   Future    Standing Expiration Date:   11/02/2022   Iron and Iron Binding Capacity ([facility] only)    Standing Status:   Future    Standing Expiration Date:   11/02/2022   Ferritin    Standing Status:   Future    Standing Expiration Date:   11/02/2022   Vitamin B12    Standing  Status:   Future  Standing Expiration Date:   11/02/2022   Methylmalonic acid, serum    Standing Status:   Future    Standing Expiration Date:   11/02/2022     All questions were answered. The patient knows to call the clinic with any problems, questions or concerns.  I have spent a total of 30 minutes minutes of face-to-face and non-face-to-face time, preparing to see the patient, obtaining and/or reviewing separately obtained history, performing a medically appropriate examination, counseling and educating the patient, ordering medications/tests, documenting clinical information in the electronic health record, and care coordination.   [name], PA-C Department of Hematology/Oncology [facility] at [facility] Phone: [phone]

## 2021-11-03 ENCOUNTER — Inpatient Hospital Stay: Payer: Managed Care, Other (non HMO) | Attending: Physician Assistant

## 2021-11-03 ENCOUNTER — Inpatient Hospital Stay (HOSPITAL_BASED_OUTPATIENT_CLINIC_OR_DEPARTMENT_OTHER): Payer: Managed Care, Other (non HMO) | Admitting: Physician Assistant

## 2021-11-03 ENCOUNTER — Other Ambulatory Visit: Payer: Self-pay

## 2021-11-03 ENCOUNTER — Inpatient Hospital Stay: Payer: Managed Care, Other (non HMO)

## 2021-11-03 VITALS — BP 115/77 | HR 74 | Temp 98.3°F | Resp 18

## 2021-11-03 VITALS — BP 133/85 | HR 95 | Temp 97.8°F | Resp 17 | Wt 289.0 lb

## 2021-11-03 DIAGNOSIS — M79605 Pain in left leg: Secondary | ICD-10-CM | POA: Diagnosis not present

## 2021-11-03 DIAGNOSIS — M79604 Pain in right leg: Secondary | ICD-10-CM | POA: Diagnosis not present

## 2021-11-03 DIAGNOSIS — F603 Borderline personality disorder: Secondary | ICD-10-CM | POA: Insufficient documentation

## 2021-11-03 DIAGNOSIS — Z836 Family history of other diseases of the respiratory system: Secondary | ICD-10-CM | POA: Diagnosis not present

## 2021-11-03 DIAGNOSIS — F319 Bipolar disorder, unspecified: Secondary | ICD-10-CM | POA: Diagnosis not present

## 2021-11-03 DIAGNOSIS — D500 Iron deficiency anemia secondary to blood loss (chronic): Secondary | ICD-10-CM

## 2021-11-03 DIAGNOSIS — M79662 Pain in left lower leg: Secondary | ICD-10-CM | POA: Diagnosis not present

## 2021-11-03 DIAGNOSIS — D508 Other iron deficiency anemias: Secondary | ICD-10-CM

## 2021-11-03 DIAGNOSIS — N920 Excessive and frequent menstruation with regular cycle: Secondary | ICD-10-CM | POA: Insufficient documentation

## 2021-11-03 DIAGNOSIS — M79661 Pain in right lower leg: Secondary | ICD-10-CM | POA: Diagnosis not present

## 2021-11-03 DIAGNOSIS — Z7901 Long term (current) use of anticoagulants: Secondary | ICD-10-CM | POA: Diagnosis not present

## 2021-11-03 DIAGNOSIS — Z8249 Family history of ischemic heart disease and other diseases of the circulatory system: Secondary | ICD-10-CM | POA: Diagnosis not present

## 2021-11-03 DIAGNOSIS — Z86711 Personal history of pulmonary embolism: Secondary | ICD-10-CM | POA: Insufficient documentation

## 2021-11-03 DIAGNOSIS — N939 Abnormal uterine and vaginal bleeding, unspecified: Secondary | ICD-10-CM | POA: Insufficient documentation

## 2021-11-03 DIAGNOSIS — E538 Deficiency of other specified B group vitamins: Secondary | ICD-10-CM | POA: Diagnosis present

## 2021-11-03 DIAGNOSIS — Z800 Family history of malignant neoplasm of digestive organs: Secondary | ICD-10-CM | POA: Insufficient documentation

## 2021-11-03 DIAGNOSIS — R0789 Other chest pain: Secondary | ICD-10-CM | POA: Diagnosis not present

## 2021-11-03 DIAGNOSIS — K909 Intestinal malabsorption, unspecified: Secondary | ICD-10-CM | POA: Insufficient documentation

## 2021-11-03 DIAGNOSIS — Z803 Family history of malignant neoplasm of breast: Secondary | ICD-10-CM | POA: Insufficient documentation

## 2021-11-03 DIAGNOSIS — Z79899 Other long term (current) drug therapy: Secondary | ICD-10-CM | POA: Insufficient documentation

## 2021-11-03 DIAGNOSIS — R896 Abnormal cytological findings in specimens from other organs, systems and tissues: Secondary | ICD-10-CM | POA: Insufficient documentation

## 2021-11-03 DIAGNOSIS — N952 Postmenopausal atrophic vaginitis: Secondary | ICD-10-CM | POA: Insufficient documentation

## 2021-11-03 DIAGNOSIS — N941 Unspecified dyspareunia: Secondary | ICD-10-CM | POA: Insufficient documentation

## 2021-11-03 DIAGNOSIS — N39491 Coital incontinence: Secondary | ICD-10-CM | POA: Insufficient documentation

## 2021-11-03 DIAGNOSIS — Z801 Family history of malignant neoplasm of trachea, bronchus and lung: Secondary | ICD-10-CM | POA: Diagnosis not present

## 2021-11-03 DIAGNOSIS — Z86718 Personal history of other venous thrombosis and embolism: Secondary | ICD-10-CM | POA: Diagnosis not present

## 2021-11-03 DIAGNOSIS — N949 Unspecified condition associated with female genital organs and menstrual cycle: Secondary | ICD-10-CM | POA: Insufficient documentation

## 2021-11-03 DIAGNOSIS — Z87891 Personal history of nicotine dependence: Secondary | ICD-10-CM | POA: Diagnosis not present

## 2021-11-03 DIAGNOSIS — G4730 Sleep apnea, unspecified: Secondary | ICD-10-CM | POA: Insufficient documentation

## 2021-11-03 LAB — CBC WITH DIFFERENTIAL (CANCER CENTER ONLY)
Abs Immature Granulocytes: 0.01 10*3/uL (ref 0.00–0.07)
Basophils Absolute: 0 10*3/uL (ref 0.0–0.1)
Basophils Relative: 1 %
Eosinophils Absolute: 0.1 10*3/uL (ref 0.0–0.5)
Eosinophils Relative: 3 %
HCT: 39.8 % (ref 36.0–46.0)
Hemoglobin: 12.7 g/dL (ref 12.0–15.0)
Immature Granulocytes: 0 %
Lymphocytes Relative: 42 %
Lymphs Abs: 1.4 10*3/uL (ref 0.7–4.0)
MCH: 27.1 pg (ref 26.0–34.0)
MCHC: 31.9 g/dL (ref 30.0–36.0)
MCV: 84.9 fL (ref 80.0–100.0)
Monocytes Absolute: 0.3 10*3/uL (ref 0.1–1.0)
Monocytes Relative: 8 %
Neutro Abs: 1.5 10*3/uL — ABNORMAL LOW (ref 1.7–7.7)
Neutrophils Relative %: 46 %
Platelet Count: 273 10*3/uL (ref 150–400)
RBC: 4.69 MIL/uL (ref 3.87–5.11)
RDW: 14.9 % (ref 11.5–15.5)
WBC Count: 3.3 10*3/uL — ABNORMAL LOW (ref 4.0–10.5)
nRBC: 0 % (ref 0.0–0.2)

## 2021-11-03 LAB — IRON AND IRON BINDING CAPACITY (CC-WL,HP ONLY)
Iron: 41 ug/dL (ref 28–170)
Saturation Ratios: 10 % — ABNORMAL LOW (ref 10.4–31.8)
TIBC: 403 ug/dL (ref 250–450)
UIBC: 362 ug/dL (ref 148–442)

## 2021-11-03 LAB — VITAMIN B12: Vitamin B-12: 453 pg/mL (ref 180–914)

## 2021-11-03 LAB — FERRITIN: Ferritin: 30 ng/mL (ref 11–307)

## 2021-11-03 MED ORDER — CYANOCOBALAMIN 1000 MCG/ML IJ SOLN
1000.0000 ug | Freq: Once | INTRAMUSCULAR | Status: AC
  Administered 2021-11-03: 1000 ug via INTRAMUSCULAR
  Filled 2021-11-03: qty 1

## 2021-11-03 MED ORDER — SODIUM CHLORIDE 0.9 % IV SOLN
200.0000 mg | Freq: Once | INTRAVENOUS | Status: AC
  Administered 2021-11-03: 200 mg via INTRAVENOUS
  Filled 2021-11-03: qty 200

## 2021-11-03 MED ORDER — SODIUM CHLORIDE 0.9 % IV SOLN: INTRAVENOUS | Status: AC

## 2021-11-03 NOTE — Patient Instructions (Signed)
[facility] ONCOLOGY  Discharge Instructions: Thank you for choosing [facility] to provide your oncology and hematology care.   If you have a lab appointment with the [facility], please go directly to the [facility] and check in at the registration area.   Wear comfortable clothing and clothing appropriate for easy access to any Portacath or PICC line.   We strive to give you quality time with your provider. You may need to reschedule your appointment if you arrive late (15 or more minutes).  Arriving late affects you and other patients whose appointments are after yours.  Also, if you miss three or more appointments without notifying the office, you may be dismissed from the clinic at the providers discretion.      For prescription refill requests, have your pharmacy contact our office and allow 72 hours for refills to be completed.    Today you received the following chemotherapy and/or immunotherapy agents Venofer      To help prevent nausea and vomiting after your treatment, we encourage you to take your nausea medication as directed.  BELOW ARE SYMPTOMS THAT SHOULD BE REPORTED IMMEDIATELY: *FEVER GREATER THAN 100.4 F (38 C) OR HIGHER *CHILLS OR SWEATING *NAUSEA AND VOMITING THAT IS NOT CONTROLLED WITH YOUR NAUSEA MEDICATION *UNUSUAL SHORTNESS OF BREATH *UNUSUAL BRUISING OR BLEEDING *URINARY PROBLEMS (pain or burning when urinating, or frequent urination) *BOWEL PROBLEMS (unusual diarrhea, constipation, pain near the anus) TENDERNESS IN MOUTH AND THROAT WITH OR WITHOUT PRESENCE OF ULCERS (sore throat, sores in mouth, or a toothache) UNUSUAL RASH, SWELLING OR PAIN  UNUSUAL VAGINAL DISCHARGE OR ITCHING   Items with * indicate a potential emergency and should be followed up as soon as possible or go to the Emergency Department if any problems should occur.  Please show the CHEMOTHERAPY ALERT CARD or IMMUNOTHERAPY ALERT CARD at check-in to the  Emergency Department and triage nurse.  Should you have questions after your visit or need to cancel or reschedule your appointment, please contact [facility]  Dept: [phone]  and follow the prompts.  Office hours are 8:00 a.m. to 4:30 p.m. Monday - Friday. Please note that voicemails left after 4:00 p.m. may not be returned until the following business day.  We are closed weekends and major holidays. You have access to a nurse at all times for urgent questions. Please call the main number to the clinic Dept: [phone] and follow the prompts.   For any non-urgent questions, you may also contact your provider using MyChart. We now offer e-Visits for anyone 69 and older to request care online for non-urgent symptoms. For details visit [email or web address].   Also download the MyChart app! Go to the app store, search "MyChart", open the app, select [facility], and log in with your MyChart username and password.  Due to Covid, a mask is required upon entering the hospital/clinic. If you do not have a mask, one will be given to you upon arrival. For doctor visits, patients may have 1 support person aged 44 or older with them. For treatment visits, patients cannot have anyone with them due to current Covid guidelines and our immunocompromised population.

## 2021-11-03 NOTE — Progress Notes (Signed)
[place] [phone]   [phone]  PROGRESS NOTE  Patient Care Team: Trey Sailors, PA as PCP - General (Physician Assistant)  Hematological/Oncological History #Recurrent DVTs/PE: 1) 06/2010: Right leg DVT and PE treated with warfarin x 3 months-felt to be provoked by OCPs 2) 2015: Left Leg DVT. Felt to be provoked by OCPs. Recommended indefinite AC and treated with warfarin. Patient self-discontinued after 45months 3) 12/2017: Right leg DVT and PE treated with warfarin-no provoking factor since not on OCPS.  4) 03/14/2020: Checked Factor V Leiden mutation, Prothrombin Mutation, Lupus anticoagulant-Not present 5) 03/30/2020: Switch to Eliquis.  6) 10/2020: Discontinued Eliquis due to cost.  7) 02/02/2021: Establish care with [name] PA-C. Recommend indefinite anticoagulation with Xarelto.   #Iron deficiency anemia: 1) 01/20/2021: Iron panel from [facility] revealed IDA with total iron 36, iron saturation 8%, ferritin 4.  2) 02/17/2021-04/14/2021: Received IV venofer x 5 doses.  3) 05/30/2021-08/11/2021: Received IV venofer x 3 doses 4)09/08/2021-11/03/2021: Received IV venofer x 3 doses.   #Vitamin B12 deficiency: 1)05/30/2021-Present: Started once monthly vitamin B12 injections  CHIEF COMPLAINT: -History of multiple DVT and PE -Iron deficiency anemia  -Vitamin B12 deficiency  HISTORY OF PRESENTING ILLNESS:  Nina Morrow 31 y.o. female who returns for a follow up for history of DVTs/PE while on Xarelto, iron deficiency and vitamin B12 deficiency. Since the last visit on 08/11/2021, she went to the ED on 10/31/2021 due to dyspnea on exertion and bilateral lower extremity pain. CTA chest was obtained that shows no evidence of pulmonary embolism.   On exam today, Nina Morrow continues to have throbbing in both her calves. She reports that she has some chest tightness occasionally. She denies that with IV iron, her energy levels do improve.   She denies nausea, vomiting or abdominal pain.  Her bowel movements are regular without any diarrhea or constipation. Her menstrual bleeding is persistent and is scheduled for hysterectomy on 11/15/21. She denies any other signs of bleeding. Patient has persistent shortness of breath, mainly with exertion. rest. She denies any fevers, chills, chest pain or cough. She has no other complaints. Rest of the 10 point ROS is below.   MEDICAL HISTORY:  Past Medical History:  Diagnosis Date   Anemia    Anxiety    Bipolar 1 disorder ([place])    with depression and anxiety. stopped taking lithium december 2021.    Cancer ([facility])    Cervical   Depression    DVT (deep venous thrombosis) (HCC)    GERD (gastroesophageal reflux disease)    Hallucination    Headache    Palpitation    PCOS (polycystic ovarian syndrome)    Pneumonia    Pulmonary emboli (H[facility])    Syncope and collapse     SURGICAL HISTORY: Past Surgical History:  Procedure Laterality Date   BARIATRIC SURGERY     CERVICAL CONIZATION W/BX N/A 07/11/2021   Procedure: CONIZATION CERVIX WITH BIOPSY;  Surgeon: Drema Dallas, DO;  Location: [facility];  Service: Gynecology;  Laterality: N/A;   HYSTEROSCOPY WITH D & C N/A 07/11/2021   Procedure: DILATATION AND CURETTAGE /HYSTEROSCOPY WITH MYOSURE;  Surgeon: Drema Dallas, DO;  Location: [facility];  Service: Gynecology;  Laterality: N/A;   TONSILLECTOMY     TONSILLECTOMY AND ADENOIDECTOMY      SOCIAL HISTORY: Social History   Socioeconomic History   Marital status: Significant Other    Spouse name: Not on file   Number of children: Not on file  Years of education: Not on file   Highest education level: Not on file  Occupational History   Not on file  Tobacco Use   Smoking status: Former    Years: 2.00    Types: E-cigarettes, Cigarettes, Cigars    Quit date: 02/04/2012    Years since quitting: 9.7   Smokeless tobacco: Never   Tobacco comments:    Used to smoke 1 pack a month  Vaping Use    Vaping Use: Former  Substance and Sexual Activity   Alcohol use: Yes    Comment: socially. 1-2 drinks/month   Drug use: Not Currently    Types: Marijuana    Comment: vapes TH; occasional mushrooms   Sexual activity: Not on file  Other Topics Concern   Not on file  Social History Narrative   Not on file   Social Determinants of Health   Financial Resource Strain: Not on file  Food Insecurity: Not on file  Transportation Needs: Not on file  Physical Activity: Not on file  Stress: Not on file  Social Connections: Not on file  Intimate Partner Violence: Not on file    FAMILY HISTORY: Family History  Problem Relation Age of Onset   Transient ischemic attack Mother        2015   Pulmonary embolism Sister        May 2021   Breast cancer Maternal Grandmother    Stomach cancer Maternal Grandfather    Lung cancer Maternal Grandfather    Breast cancer Cousin     ALLERGIES:  is allergic to aripiprazole, dust mite extract, grass extracts [gramineae pollens], and latex.  MEDICATIONS:  Current Outpatient Medications  Medication Sig Dispense Refill   albuterol (VENTOLIN HFA) 108 (90 Base) MCG/ACT inhaler Inhale 2 puffs into the lungs every 6 (six) hours as needed for wheezing or shortness of breath. prn     buPROPion (WELLBUTRIN XL) 300 MG 24 hr tablet Take 300 mg by mouth daily.     gabapentin (NEURONTIN) 600 MG tablet Take 600 mg by mouth at bedtime.     Magnesium Oxide 200 MG TABS Take 200 mg by mouth daily.     norethindrone (MICRONOR) 0.35 MG tablet Take 1 tablet by mouth daily.     rivaroxaban (XARELTO) 20 MG TABS tablet Take 1 tablet (20 mg total) by mouth daily with supper. 30 tablet 3   topiramate (TOPAMAX) 100 MG tablet Take 100 mg by mouth daily.     traZODone (DESYREL) 100 MG tablet Take 100 mg by mouth at bedtime.     estradiol (ESTRACE) 0.1 MG/GM vaginal cream Place 1 Applicatorful vaginally 2 (two) times a week.     No current facility-administered medications for  this visit.    REVIEW OF SYSTEMS:   Constitutional: ( - ) fevers, ( - )  chills , (  ) night sweats Eyes: ( - ) blurriness of vision, ( - ) double vision, ( - ) watery eyes Ears, nose, mouth, throat, and face: ( - ) mucositis, ( - ) sore throat Respiratory: ( - ) cough, ( - ) dyspnea, ( - ) wheezes Cardiovascular: ( - ) palpitation, ( - ) chest discomfort, ( - ) lower extremity swelling Gastrointestinal:  (- ) nausea, ( - ) heartburn, ( - ) change in bowel habits Skin: ( - ) abnormal skin rashes Lymphatics: ( - ) new lymphadenopathy, ( - ) easy bruising Neurological: ( - ) numbness, ( - ) tingling, ( - ) new  weaknesses Behavioral/Psych: ( - ) mood change, ( - ) new changes  All other systems were reviewed with the patient and are negative.  PHYSICAL EXAMINATION: ECOG PERFORMANCE STATUS: 1 - Symptomatic but completely ambulatory  Vitals:   11/03/21 0841  BP: 133/85  Pulse: 95  Resp: 17  Temp: 97.8 F (36.6 C)  SpO2: 99%   Filed Weights   11/03/21 0841  Weight: 289 lb (131.1 kg)    GENERAL: well appearing african [place] female in NAD  SKIN: skin color, texture, turgor are normal, no rashes or significant lesions EYES: conjunctiva are pink and non-injected, sclera clear OROPHARYNX: no exudate, no erythema; lips, buccal mucosa, and tongue normal  LUNGS: clear to auscultation and percussion with normal breathing effort HEART: regular rate & rhythm and no murmurs and no lower extremity edema ABDOMEN: soft, non-tender, non-distended, normal bowel sounds Musculoskeletal: no cyanosis of digits and no clubbing. Tenderness to palpation of bilateral calves. No erythema or edema in lower extremities.  PSYCH: alert & oriented x 3, fluent speech NEURO: no focal motor/sensory deficits  LABORATORY DATA:  I have reviewed the data as listed CBC Latest Ref Rng & Units 11/03/2021 10/31/2021 10/06/2021  WBC 4.0 - 10.5 K/uL 3.3(L) 3.8(L) 3.7(L)  Hemoglobin 12.0 - 15.0 g/dL 12.7 14.3 12.8   Hematocrit 36.0 - 46.0 % 39.8 45.2 40.5  Platelets 150 - 400 K/uL 273 287 253    CMP Latest Ref Rng & Units 10/31/2021 06/26/2021 05/26/2021  Glucose 70 - 99 mg/dL 81 89 88  BUN 6 - 20 mg/dL 9 9 6   Creatinine 0.44 - 1.00 mg/dL 0.93 0.83 0.76  Sodium 135 - 145 mmol/L 134(L) 141 137  Potassium 3.5 - 5.1 mmol/L 3.7 3.9 4.1  Chloride 98 - 111 mmol/L 102 108 103  CO2 22 - 32 mmol/L 23 27 26   Calcium 8.9 - 10.3 mg/dL 9.6 8.4(L) 9.4  Total Protein 6.5 - 8.1 g/dL - 6.7 7.3  Total Bilirubin 0.3 - 1.2 mg/dL - 0.2(L) 0.8  Alkaline Phos 38 - 126 U/L - 61 68  AST 15 - 41 U/L - 17 21  ALT 0 - 44 U/L - 18 19   ASSESSMENT & PLAN Nina Morrow is a 31 y.o. female returns for a follow up for history of recurrent DVT/PE along with vitamin B12 and iron deficiency anemia.   #Multiple DVT's and PE's: --Hypercoagulable workup was completed by [facility] in May 2021 which included Factor V Leiden mutation, Prothrombin Mutation, Lupus anticoagulant, all three were unremarkable.  -Remaining hypercoagulable workup was done here on 02/03/2021 checking protein C and protein S activity, beta-2 glycoprotein antibody and cardiolipin antibody. Findings were unremarkable except for mild Protein S activity.  --Recommend indefinite anticoagulation due to recurrent DVT/PEs.  --Currently on Xarelto with good tolerance.  --Due to bilateral calf pain, we will schedule doppler US Monday, 11/06/2021 to rule out acute DVT.   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  --Patient recently currently on progesterone containing birth control pills.   --Patient underwent Cold Knife Conization of the cervix on 07/11/2021 and scheduled to undergo hysterectomy on 11/15/2021.  --Received once a week IV venofer x 11 doses since 02/17/2021.  --Labs today  hemoglobin is still within normal limits. Iron panel shows that serum iron is 41, iron saturation is 10% and ferritin is 30.  --  Patient will undergo hysterectomy on 11/15/2021.  --Patient will proceed with IV venofer 200 mg today.  #B12 deficiency: --Labs today show vitamin B12  levels have improved from 267 to 453.  --Secondary to gastric sleeve surgery.  --Patient will proceed with IM injection today.   Follow up: ---RTC in 5 weeks with labs.    No orders of the defined types were placed in this encounter.   All questions were answered. The patient knows to call the clinic with any problems, questions or concerns.  I have spent a total of 30 minutes minutes of face-to-face and non-face-to-face time, preparing to see the patient, obtaining and/or reviewing separately obtained history, performing a medically appropriate examination, counseling and educating the patient, ordering medications/tests, documenting clinical information in the electronic health record, and care coordination.   [name], PA-C Department of Hematology/Oncology [place] at [facility] Phone: [phone]

## 2021-11-03 NOTE — Progress Notes (Signed)
Patient refused to stay for observation. Vital signs stable at discharge. Patient understands to call clinic with any concerns or questions.

## 2021-11-05 ENCOUNTER — Encounter: Payer: Self-pay | Admitting: Physician Assistant

## 2021-11-05 LAB — METHYLMALONIC ACID, SERUM: Methylmalonic Acid, Quantitative: 114 nmol/L (ref 0–378)

## 2021-11-06 ENCOUNTER — Ambulatory Visit (HOSPITAL_COMMUNITY)
Admission: RE | Admit: 2021-11-06 | Discharge: 2021-11-06 | Disposition: A | Discharge status: Home / Self Care | Payer: Managed Care, Other (non HMO) | Source: Ambulatory Visit | Attending: Physician Assistant | Admitting: Physician Assistant

## 2021-11-06 ENCOUNTER — Telehealth: Payer: Self-pay | Admitting: Physician Assistant

## 2021-11-06 ENCOUNTER — Other Ambulatory Visit: Payer: Self-pay

## 2021-11-06 ENCOUNTER — Telehealth: Payer: Self-pay

## 2021-11-06 DIAGNOSIS — M79661 Pain in right lower leg: Secondary | ICD-10-CM | POA: Diagnosis not present

## 2021-11-06 DIAGNOSIS — M79662 Pain in left lower leg: Secondary | ICD-10-CM | POA: Insufficient documentation

## 2021-11-06 DIAGNOSIS — Z86718 Personal history of other venous thrombosis and embolism: Secondary | ICD-10-CM | POA: Insufficient documentation

## 2021-11-06 NOTE — Telephone Encounter (Signed)
Pt is having a doppler today so pt was advised via My Chart.

## 2021-11-06 NOTE — Telephone Encounter (Signed)
Scheduled per 1/6 los, pt stated they have surgery mid January and does not know if they will be able to make appt. I told pt to call back if they need to r/s closer to appt date

## 2021-11-06 NOTE — Telephone Encounter (Signed)
-----   Message from [name], PA-C sent at 11/05/2021  5:53 PM EST ----- Please notify patient that iron and B12 levels have improved. We will see patient back one month after her upcoming surgery to determine if additional IV iron and B12 injections are needed.

## 2021-11-06 NOTE — Progress Notes (Signed)
Bilateral lower extremity venous duplex has been completed. Preliminary results can be found in CV Proc through chart review.  Results were given to [name] PA.  11/06/21 11:29 AM Nina Morrow RVT

## 2021-11-07 ENCOUNTER — Telehealth: Payer: Self-pay

## 2021-11-07 NOTE — Telephone Encounter (Signed)
Pt advised with understanding. 

## 2021-11-07 NOTE — Telephone Encounter (Signed)
-----   Message from [name], PA-C sent at 11/07/2021  8:32 AM EST ----- Please notify patient that Nina Morrow shows no evidence of DVT in her legs.

## 2021-11-10 NOTE — Progress Notes (Signed)
Surgical Instructions    Your procedure is scheduled on 11/15/21.  Report to [facility] Main Entrance "A" at 9:40 A.M., then check in with the Admitting office.  Call this number if you have problems the morning of surgery:  [phone]   If you have any questions prior to your surgery date call [phone]: Open Monday-Friday 8am-4pm    Remember:  Do not eat or drink after midnight the night before your surgery     Take these medicines the morning of surgery with A SIP OF WATER: buPROPion (WELLBUTRIN XL)  norethindrone (MICRONOR) topiramate (TOPAMAX) albuterol (VENTOLIN HFA) - as needed  Follow your surgeon's instructions on when to stop rivaroxaban (XARELTO).  If no instructions were given by your surgeon then you will need to call the office to get those instructions.      As of today, STOP taking any Aspirin (unless otherwise instructed by your surgeon) Aleve, Naproxen, Ibuprofen, Motrin, Advil, Goody's, BC's, all herbal medications, fish oil, and all vitamins.   After your COVID test   You are not required to quarantine however you are required to wear a well-fitting mask when you are out and around people not in your household.  If your mask becomes wet or soiled, replace with a new one.  Wash your hands often with soap and water for 20 seconds or clean your hands with an alcohol-based hand sanitizer that contains at least 60% alcohol.  Do not share personal items.  Notify your provider: if you are in close contact with someone who has COVID  or if you develop a fever of 100.4 or greater, sneezing, cough, sore throat, shortness of breath or body aches.           Do not wear jewelry or makeup Do not wear lotions, powders, perfumes or deodorant. Do not shave 48 hours prior to surgery. Do not bring valuables to the hospital. DO Not wear nail polish, gel polish, artificial nails, or any other type of covering on natural nails including finger and toenails. If patients  have artificial nails, gel coating, etc. that need to be removed by a nail salon, please have this removed prior to surgery or surgery may need to be canceled/delayed if the surgeon/ anesthesia feels like the patient is unable to be adequately monitored.             [facility] is not responsible for any belongings or valuables.  Do NOT Smoke (Tobacco/Vaping)  24 hours prior to your procedure  If you use a CPAP at night, you may bring your mask for your overnight stay.   Contacts, glasses, hearing aids, dentures or partials may not be worn into surgery, please bring cases for these belongings   For patients admitted to the hospital, discharge time will be determined by your treatment team.   Patients discharged the day of surgery will not be allowed to drive home, and someone needs to stay with them for 24 hours.  NO VISITORS WILL BE ALLOWED IN PRE-OP WHERE PATIENTS ARE PREPPED FOR SURGERY.  ONLY 1 SUPPORT PERSON MAY BE PRESENT IN THE WAITING ROOM WHILE YOU ARE IN SURGERY.  IF YOU ARE TO BE ADMITTED, ONCE YOU ARE IN YOUR ROOM YOU WILL BE ALLOWED TWO (2) VISITORS. 1 (ONE) VISITOR MAY STAY OVERNIGHT BUT MUST ARRIVE TO THE ROOM BY 8pm.  Minor children may have two parents present. Special consideration for safety and communication needs will be reviewed on a case by case basis.  Special  instructions:    Oral Hygiene is also important to reduce your risk of infection.  Remember - BRUSH YOUR TEETH THE MORNING OF SURGERY WITH YOUR REGULAR TOOTHPASTE   [facility]- Preparing For Surgery  Before surgery, you can play an important role. Because skin is not sterile, your skin needs to be as free of germs as possible. You can reduce the number of germs on your skin by washing with CHG (chlorahexidine gluconate) Soap before surgery.  CHG is an antiseptic cleaner which kills germs and bonds with the skin to continue killing germs even after washing.     Please do not use if you have an allergy to CHG or  antibacterial soaps. If your skin becomes reddened/irritated stop using the CHG.  Do not shave (including legs and underarms) for at least 48 hours prior to first CHG shower. It is OK to shave your face.  Please follow these instructions carefully.     Shower the NIGHT BEFORE SURGERY and the MORNING OF SURGERY with CHG Soap.   If you chose to wash your hair, wash your hair first as usual with your normal shampoo. After you shampoo, rinse your hair and body thoroughly to remove the shampoo.  Then [employer] and genitals (private parts) with your normal soap and rinse thoroughly to remove soap.  After that Use CHG Soap as you would any other liquid soap. You can apply CHG directly to the skin and wash gently with a scrungie or a clean washcloth.   Apply the CHG Soap to your body ONLY FROM THE NECK DOWN.  Do not use on open wounds or open sores. Avoid contact with your eyes, ears, mouth and genitals (private parts). Wash Face and genitals (private parts)  with your normal soap.   Wash thoroughly, paying special attention to the area where your surgery will be performed.  Thoroughly rinse your body with warm water from the neck down.  DO NOT shower/wash with your normal soap after using and rinsing off the CHG Soap.  Pat yourself dry with a CLEAN TOWEL.  Wear CLEAN PAJAMAS to bed the night before surgery  Place CLEAN SHEETS on your bed the night before your surgery  DO NOT SLEEP WITH PETS.   Day of Surgery:  Take a shower with CHG soap. Wear Clean/Comfortable clothing the morning of surgery Do not apply any deodorants/lotions.   Remember to brush your teeth WITH YOUR REGULAR TOOTHPASTE.   Please read over the following fact sheets that you were given.

## 2021-11-13 ENCOUNTER — Other Ambulatory Visit: Payer: Self-pay

## 2021-11-13 ENCOUNTER — Encounter (HOSPITAL_COMMUNITY)
Admission: RE | Admit: 2021-11-13 | Discharge: 2021-11-13 | Disposition: A | Discharge status: Home / Self Care | Payer: Managed Care, Other (non HMO) | Source: Ambulatory Visit | Attending: Obstetrics and Gynecology | Admitting: Obstetrics and Gynecology

## 2021-11-13 ENCOUNTER — Encounter (HOSPITAL_COMMUNITY): Payer: Self-pay

## 2021-11-13 VITALS — BP 125/71 | HR 86 | Temp 98.4°F | Resp 18 | Ht 72.0 in | Wt 293.7 lb

## 2021-11-13 DIAGNOSIS — D649 Anemia, unspecified: Secondary | ICD-10-CM | POA: Insufficient documentation

## 2021-11-13 DIAGNOSIS — D069 Carcinoma in situ of cervix, unspecified: Secondary | ICD-10-CM | POA: Diagnosis not present

## 2021-11-13 DIAGNOSIS — Z20822 Contact with and (suspected) exposure to covid-19: Secondary | ICD-10-CM | POA: Insufficient documentation

## 2021-11-13 DIAGNOSIS — G4733 Obstructive sleep apnea (adult) (pediatric): Secondary | ICD-10-CM | POA: Diagnosis not present

## 2021-11-13 DIAGNOSIS — Z87891 Personal history of nicotine dependence: Secondary | ICD-10-CM | POA: Insufficient documentation

## 2021-11-13 DIAGNOSIS — Z9884 Bariatric surgery status: Secondary | ICD-10-CM | POA: Diagnosis not present

## 2021-11-13 DIAGNOSIS — F319 Bipolar disorder, unspecified: Secondary | ICD-10-CM | POA: Diagnosis not present

## 2021-11-13 DIAGNOSIS — E282 Polycystic ovarian syndrome: Secondary | ICD-10-CM | POA: Insufficient documentation

## 2021-11-13 DIAGNOSIS — N939 Abnormal uterine and vaginal bleeding, unspecified: Secondary | ICD-10-CM | POA: Diagnosis not present

## 2021-11-13 DIAGNOSIS — R55 Syncope and collapse: Secondary | ICD-10-CM | POA: Insufficient documentation

## 2021-11-13 DIAGNOSIS — Z01812 Encounter for preprocedural laboratory examination: Secondary | ICD-10-CM | POA: Diagnosis present

## 2021-11-13 DIAGNOSIS — Z86711 Personal history of pulmonary embolism: Secondary | ICD-10-CM | POA: Diagnosis not present

## 2021-11-13 DIAGNOSIS — K219 Gastro-esophageal reflux disease without esophagitis: Secondary | ICD-10-CM | POA: Insufficient documentation

## 2021-11-13 DIAGNOSIS — Z01818 Encounter for other preprocedural examination: Secondary | ICD-10-CM

## 2021-11-13 DIAGNOSIS — Z86718 Personal history of other venous thrombosis and embolism: Secondary | ICD-10-CM | POA: Insufficient documentation

## 2021-11-13 DIAGNOSIS — Z7901 Long term (current) use of anticoagulants: Secondary | ICD-10-CM | POA: Insufficient documentation

## 2021-11-13 LAB — CBC
HCT: 41.8 % (ref 36.0–46.0)
Hemoglobin: 13 g/dL (ref 12.0–15.0)
MCH: 27.1 pg (ref 26.0–34.0)
MCHC: 31.1 g/dL (ref 30.0–36.0)
MCV: 87.3 fL (ref 80.0–100.0)
Platelets: 278 10*3/uL (ref 150–400)
RBC: 4.79 MIL/uL (ref 3.87–5.11)
RDW: 15.4 % (ref 11.5–15.5)
WBC: 4.5 10*3/uL (ref 4.0–10.5)
nRBC: 0 % (ref 0.0–0.2)

## 2021-11-13 LAB — COMPREHENSIVE METABOLIC PANEL
ALT: 18 U/L (ref 0–44)
AST: 18 U/L (ref 15–41)
Albumin: 3.7 g/dL (ref 3.5–5.0)
Alkaline Phosphatase: 77 U/L (ref 38–126)
Anion gap: 9 (ref 5–15)
BUN: 6 mg/dL (ref 6–20)
CO2: 29 mmol/L (ref 22–32)
Calcium: 9.1 mg/dL (ref 8.9–10.3)
Chloride: 101 mmol/L (ref 98–111)
Creatinine, Ser: 0.95 mg/dL (ref 0.44–1.00)
GFR, Estimated: >60 mL/min (ref >60)
Glucose, Bld: 90 mg/dL (ref 70–99)
Potassium: 4.2 mmol/L (ref 3.5–5.1)
Sodium: 139 mmol/L (ref 135–145)
Total Bilirubin: 0.5 mg/dL (ref 0.3–1.2)
Total Protein: 7.7 g/dL (ref 6.5–8.1)

## 2021-11-13 LAB — TYPE AND SCREEN
ABO/RH(D): A POS
Antibody Screen: NEGATIVE

## 2021-11-13 LAB — SARS CORONAVIRUS 2 (TAT 6-24 HRS): SARS Coronavirus 2: NEGATIVE

## 2021-11-13 NOTE — Anesthesia Preprocedure Evaluation (Addendum)
Anesthesia Evaluation  Patient identified by MRN, date of birth, ID band Patient awake    Reviewed: Allergy & Precautions, NPO status , Patient's Chart, lab work & pertinent test results  Airway Mallampati: II  TM Distance: >3 FB Neck ROM: Full    Dental no notable dental hx.    Pulmonary sleep apnea , former smoker,    Pulmonary exam normal breath sounds clear to auscultation       Cardiovascular negative cardio ROS Normal cardiovascular exam Rhythm:Regular Rate:Normal     Neuro/Psych  Headaches, Anxiety Depression Bipolar Disorder negative psych ROS   GI/Hepatic Neg liver ROS, GERD  ,  Endo/Other  negative endocrine ROS  Renal/GU negative Renal ROS  negative genitourinary   Musculoskeletal negative musculoskeletal ROS (+)   Abdominal (+) + obese,   Peds negative pediatric ROS (+)  Hematology negative hematology ROS (+) anemia ,   Anesthesia Other Findings   Reproductive/Obstetrics negative OB ROS                            Anesthesia Physical Anesthesia Plan  ASA: 3  Anesthesia Plan: General   Post-op Pain Management:    Induction: Intravenous  PONV Risk Score and Plan: 3 and Ondansetron, Dexamethasone, Midazolam and Treatment may vary due to age or medical condition  Airway Management Planned: Oral ETT  Additional Equipment:   Intra-op Plan:   Post-operative Plan: Extubation in OR  Informed Consent: I have reviewed the patients History and Physical, chart, labs and discussed the procedure including the risks, benefits and alternatives for the proposed anesthesia with the patient or authorized representative who has indicated his/her understanding and acceptance.     Dental advisory given  Plan Discussed with: CRNA  Anesthesia Plan Comments: (See PAT note written 11/13/2021 by Myra Gianotti, PA-C. )       Anesthesia Quick Evaluation

## 2021-11-13 NOTE — Progress Notes (Signed)
PCP - Raelyn Number, PA Cardiologist - Dr. Edwyna Shell  PPM/ICD - n/a  Chest x-ray - 10/31/21 EKG - 10/31/21 Stress Test - denies ECHO - 04/06/21 Cardiac Cath -denies   Sleep Study - OSA+ CPAP - d/t insurance coverage issues pt unable to use CPAP  Blood Thinner Instructions: Xarelto; LD 1/15. Hold 2 days pre-op Aspirin Instructions: n/a  NPO at MD  COVID TEST- 11/13/21, done in PAT.  Anesthesia review: Yes, pt discussed in PAT appt about recent ED visit, syncopal episodes, and heart palpitations. Myra Gianotti, PA-C spoke with pt during visit to address any concerns.   Patient denies shortness of breath, fever, cough and chest pain at PAT appointment   All instructions explained to the patient, with a verbal understanding of the material. Patient agrees to go over the instructions while at home for a better understanding. Patient also instructed to self quarantine after being tested for COVID-19. The opportunity to ask questions was provided.

## 2021-11-13 NOTE — Progress Notes (Addendum)
Anesthesia PAT Evaluation:  Case: [id number] Date/Time: 11/15/21 1125   Procedure: XI ROBOTIC ASSISTED LAPAROSCOPIC HYSTERECTOMY AND BILATERAL SALPINGECTOMY (Bilateral)   Anesthesia type: Choice   Pre-op diagnosis: Abnormal Uterine Bleeding   Location: [facility] OR ROOM 10 / [place] OR   Surgeons: Drema Dallas, DO       DISCUSSION: Patient is a 31 year old female scheduled for the above procedure.  She is s/p hysteroscopy with D&C, MyoSure polypectomy, cold knife conization for AUB and high-grade cervical dysplasia on 07/11/21.  History includes former smoker (quit 02/04/12), DVT/PE (recurrent, despite stopping OCPs), OSA (not currently using CPAP; says she needs a new sleep study before being fitted with a new mask), Bipolar 1 disorder, hallucinations, PCOS, palpitations, syncope/presyncope (recurrent), GERD, anemia (s/p IV Venofer & B12 injections, last 11/03/21), cervical cancer (severe dysplasia, s/p cold knife conization 07/11/21), bariatric surgery (~ 09/2019), T&A (for childhood OSA). Occasional marijuana (vapes) and "mushroom" use. Last [facility] admission in late July 2022.   Last hematology evaluation on 11/03/21 by [name], PA-C. She is aware of surgery plans. She outlined DVT/PE history:  06/2010: Right leg DVT and PE treated with warfarin x 3 months-felt to be provoked by OCPs 2015: Left Leg DVT. Felt to be provoked by OCPs. Recommended indefinite AC and treated with warfarin. Patient self-discontinued after 6 months 12/2017: Right leg DVT and PE treated with warfarin-no provoking factor since not on OCPS.  03/14/2020: Checked Factor V Leiden mutation, Prothrombin Mutation, Lupus anticoagulant-Not present 03/30/2020: Switch to Eliquis.  10/2020: Discontinued Eliquis due to cost.  02/02/2021: Establish care with [name] PA-C. Recommend indefinite anticoagulation with Xarelto. Remaining hypercoagulable workup was done here on 02/03/2021 checking protein C and protein S activity, beta-2  glycoprotein antibody and cardiolipin antibody. Findings were unremarkable except for mild Protein S activity.    Last Xarelto 11/12/2021. CTA negative for PE 10/31/21. Negative BLE [place] for DVT 11/06/21.   She had neurology and cardiology evaluation ins 2022 for syncope. Per 02/16/21 [facility] neurology note by Dr. 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 Will get w/u for possible seizure. If negative may benefit from cardiology evaluation. Recommend she not drive until spells controlled. 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 Gretchen Portela who ordered echo and 30 day event monitor which were also reassuring. Orthostatics were negative. She was also referred back to Sleep Medicine for a repeat sleep study, as she needs a new mask since her move to [facility] from [place] in 2021. A repeat sleep study was recommended, but she said she could not afford one at this time. Cardiologist Dr. Iona Beard cleared her for GYN 06/2021 surgery with recommendation to avoid BP and HR fluctuations, monitor electrolytes (K+, Mg). She is on MagOx for palpitation prophylaxis. Hematology had also given permission to hold Xarelto for 48 hours prior to that surgery and resume once hemostasis achieved.    I evaluated her a her 11/13/21 PAT visit due to ongoing issues with primarily presyncope. This has been on-going since before her neurology and cardiology evaluations last year. She says episodes seem mostly "random" but can happen up to about twice a week. One trigger does seem to be when taking a shower, and says she is to have a "life guard" available during shower time. Sometimes if she walks too far she will start to have palpitations. She says that when she gets palpitations, she  will feel short of breath and then have "tunnel vision" and will have to lie  down or put her head down to abort a syncopal event. Last presyncopal episode was on 11/13/21. She also had an ED visit on 10/31/21 for SOB and tachycardia. She had reported some congestion the week prior and also some calf pain. She remained on Xarelto. Anxiety was felt to be somewhat contributing to her symptoms, but given her DVT/PT history, a CTA of the chest was done and was NEGATIVE for PE, pericardial effusion, mediastinal lymphadenopathy, pleural effusion and pneumothorax. There was evidence of likely prior sleeve gastrectomy. ED provider questioned if PPI would help her symptoms and advised PCP follow-up. She had follow-up with hematology on 11/03/21 who ordered a BLE venous Duplex to further evaluate bilateral calf throbbing, and 11/06/21 Duplex was negative for DVT.     She says her symptoms have been typical for her. She if fortunately, able to identify when she is feeling presyncopal and can abort a syncopal event by lying down or placing her head down. She had a fairly extensive work-up through [facility] less than a year ago with overall reassuring testing. VSS. She had recent EKG, and negative CTA chest and LE venous US. Discussed with anesthesiologist [place]us, Belenda Cruise, DO. If no new or worsening symptoms (ie, different from her baseline), then would not anticipate further preoperative testing. Anesthesia team to evaluate on the day of surgery. If new concerns prior to surgery, then I told her she could contact me at my office number.   11/14/11 presurgical COVID-19 test in process. She will need a pregnancy test on arrival for surgery.  Anesthesia team to re-evaluate on the day of surgery.   VS: BP 125/71    Pulse 86    Temp 36.9 C (Oral)    Resp 18    Ht 6' (1.829 m)    Wt 133.2 kg    LMP 11/07/2021 (Exact Date)    SpO2 98%    BMI 39.83 kg/m Provider and patient wore universal face mask. Evaluation < 10 minutes. Subjective as above. Heart RRR, no murmur noted. 10/31/21 EKG reviewed. Lungs clear. A&O  x4. No current chest pain  or SOB. She is able to ambulate independently, and was steady walking in PAT hallway. She declined assistance walking to front of the hospital, as her boyfriend was waiting in waiting room.    PROVIDERS: Trey Sailors, PA is PCP Marina Goodell, MD is cardiologist [facility] [place]). Last visit 07/04/21. Next visit is scheduled for 02/01/22.  Chales Salmon, MD is neurologist [phone]). Last visit 06/13/21.  - She had new patient sleep consult on 06/13/21 with Nani Skillern, [place] at [facility] who plans to obtain records from her prior sleep study to determine best next steps in treating her OSA.    LABS: Labs reviewed: Acceptable for surgery. (all labs ordered are listed, but only abnormal results are displayed)  Labs Reviewed  SARS CORONAVIRUS 2 (TAT 6-24 HRS)  COMPREHENSIVE METABOLIC PANEL  CBC  TYPE AND SCREEN    OTHER: EEG (awake & drowsy) 02/21/21 ([facility]): INTERPRETATION:    -No clear and precise evidence was seen for any focal or diffuse abnormality.    -Drowsiness and sleep were  achieved.  -No clear epileptiform activity was noted and therefore no definite evidence was noted for a seizure disorder.         IMAGES: CTA Chest 10/31/21: IMPRESSION: 1. No evidence of pulmonary embolism or acute cardiopulmonary process. 2. Postsurgical changes  for prior sleeve gastrectomy, no acute upper abdominal process.   CXR 10/31/21: FINDINGS: The heart size and mediastinal contours are within normal limits. Both lungs are clear. The visualized skeletal structures are unremarkable. IMPRESSION: No active cardiopulmonary disease.  MRI Head 04/07/21 ([facility]): IMPRESSION:  Normal MRI of the head.       EKG: EKG 10/31/21: Sinus tachycardia Right atrial enlargement Borderline ECG When compared with ECG of 22-Nov-2020 22:43, Premature atrial complexes are no longer Present Vent. rate has increased BY 63 BPM Nonspecific T wave abnormality now evident in  Lateral leads Confirmed by Varney Biles [phone]) on 10/31/2021 11:45:22 AM  EKG 03/15/21 ([facility]): Per Result Narrative. Sinus  Bradycardia, HR 58  Slight early regularization changes.  WITHIN NORMAL LIMITS     CV: BLE Venous US 11/06/21: Summary:  RIGHT:  - There is no evidence of deep vein thrombosis in the lower extremity.  - No cystic structure found in the popliteal fossa.  LEFT:  - There is no evidence of deep vein thrombosis in the lower extremity.  - No cystic structure found in the popliteal fossa.      Echo 04/06/21 ([facility]): Impression: Left Ventricle: Systolic function is normal. EF: 60%.    Left Ventricle: There is borderline hypertrophy.    Left Atrium: Left atrium is moderately dilated.    Left Ventricle: Wall motion is normal.    Left Ventricle: Doppler parameters indicate normal diastolic function.    Left Atrium: The interatrial septal aneurysm noted.  Intra-arterial  septum is mobile and bows toward the right.    Tricuspid Valve: The right ventricular systolic pressure is normal (<36  mmHg).  - No significant valvular disease noted.     30 day Cardiac Event Monitor 03/20/21-04/18/21 ([facility]): Indication: Palpitations.  Syncope.     Patient remained in sinus rhythm with average heart rate 80.   Minimum HR 46 bpm at 6:08 AM on May 31.  Maximum HR 184 bpm at 12:02 PM on June 2.  It was sinus tachycardia.   Patient remained bradycardic 5 % of total time.  The longest episode of bradycardia lasted 37 minutes and 26 seconds at 3 AM on June 8.  Average HR during bradycardia 55 beats per minute.   Patient remained tachycardic 12 % of total time.  The longest episode of tachycardia lasted 1 hour 39 minutes and 23 seconds at 12:22 AM on June 15.  Average HR during tachycardia 121 beats per minute.   Few PVCs noted representing  < 1% total beats.   1 run of 4 beat wide-complex tachycardia noted with heart rate about 120 bpm.  The rhythm was very irregular and  likely represented quadruplet of PVCs.  Patient remained asymptomatic during episode.   Few supraventricular ectopic beats noted representing < 1% total beats.   4 patient triggered episodes were recorded.  During these episodes patient felt palpitations, dizziness, shortness of breath, CP and one episode of syncope.  The strips during those episodes revealed sinus rhythm with heart rate 56 - 113 with no arrhythmias noted.  During episode of syncope, palpitations, chest pain, fatigue at 11:36 AM on June 8 sinus rhythm with heart rate 99 bpm noted with no arrhythmias.   The longest R to R interval 1.37 seconds at 6:08 AM on May 31.   No SVT, no A. fib, no long pauses 3 seconds or more noted.   Past Medical History:  Diagnosis Date   Anemia    Anxiety  Bipolar 1 disorder ([place])    with depression and anxiety. stopped taking lithium december 2021. Pt states misdiagnosis, she actually has BPD.   Cancer ([place])    Cervical   Depression    DVT (deep venous thrombosis) ([facility])    GERD (gastroesophageal reflux disease)    Hallucination    Headache    Palpitation    PCOS (polycystic ovarian syndrome)    Pneumonia    Pulmonary emboli ([facility])    Syncope and collapse     Past Surgical History:  Procedure Laterality Date   BARIATRIC SURGERY     CERVICAL CONIZATION W/BX N/A 07/11/2021   Procedure: CONIZATION CERVIX WITH BIOPSY;  Surgeon: Drema Dallas, DO;  Location: [place];  Service: Gynecology;  Laterality: N/A;   HYSTEROSCOPY WITH D & C N/A 07/11/2021   Procedure: DILATATION AND CURETTAGE /HYSTEROSCOPY WITH MYOSURE;  Surgeon: Drema Dallas, DO;  Location: [place];  Service: Gynecology;  Laterality: N/A;   TONSILLECTOMY     TONSILLECTOMY AND ADENOIDECTOMY      MEDICATIONS:  albuterol (VENTOLIN HFA) 108 (90 Base) MCG/ACT inhaler   buPROPion (WELLBUTRIN XL) 300 MG 24 hr tablet   estradiol (ESTRACE) 0.1 MG/GM vaginal cream   gabapentin (NEURONTIN) 600 MG tablet   Magnesium Oxide 200 MG  TABS   norethindrone (MICRONOR) 0.35 MG tablet   rivaroxaban (XARELTO) 20 MG TABS tablet   topiramate (TOPAMAX) 100 MG tablet   traZODone (DESYREL) 100 MG tablet   No current facility-administered medications for this encounter.    Myra Gianotti, PA-C Surgical Short Stay/Anesthesiology [facility] Phone [phone] [facility] Phone [phone] 11/13/2021 1:48 PM

## 2021-11-14 ENCOUNTER — Encounter (HOSPITAL_COMMUNITY): Payer: Self-pay

## 2021-11-14 NOTE — Progress Notes (Addendum)
Called patient to update on surgery time. No answer. Left voicemail with new arrival time of 0630.  Patient's partner voiced understanding new arrival time of 0630

## 2021-11-15 ENCOUNTER — Encounter (HOSPITAL_COMMUNITY)
Admission: RE | Disposition: A | Discharge status: Home / Self Care | Payer: Self-pay | Source: Home / Self Care | Attending: Obstetrics and Gynecology

## 2021-11-15 ENCOUNTER — Encounter (HOSPITAL_COMMUNITY): Payer: Self-pay | Admitting: Obstetrics and Gynecology

## 2021-11-15 ENCOUNTER — Ambulatory Visit (HOSPITAL_COMMUNITY): Payer: Managed Care, Other (non HMO) | Admitting: Vascular Surgery

## 2021-11-15 ENCOUNTER — Other Ambulatory Visit: Payer: Self-pay

## 2021-11-15 ENCOUNTER — Observation Stay (HOSPITAL_COMMUNITY)
Admission: RE | Admit: 2021-11-15 | Discharge: 2021-11-16 | Disposition: A | Discharge status: Home / Self Care | Payer: Managed Care, Other (non HMO) | Attending: Obstetrics and Gynecology | Admitting: Obstetrics and Gynecology

## 2021-11-15 ENCOUNTER — Ambulatory Visit (HOSPITAL_COMMUNITY): Payer: Managed Care, Other (non HMO) | Admitting: Anesthesiology

## 2021-11-15 DIAGNOSIS — Z86711 Personal history of pulmonary embolism: Secondary | ICD-10-CM | POA: Diagnosis not present

## 2021-11-15 DIAGNOSIS — Z86718 Personal history of other venous thrombosis and embolism: Secondary | ICD-10-CM | POA: Diagnosis not present

## 2021-11-15 DIAGNOSIS — N939 Abnormal uterine and vaginal bleeding, unspecified: Secondary | ICD-10-CM | POA: Diagnosis present

## 2021-11-15 DIAGNOSIS — Z87891 Personal history of nicotine dependence: Secondary | ICD-10-CM | POA: Insufficient documentation

## 2021-11-15 DIAGNOSIS — D509 Iron deficiency anemia, unspecified: Secondary | ICD-10-CM | POA: Insufficient documentation

## 2021-11-15 DIAGNOSIS — Z7901 Long term (current) use of anticoagulants: Secondary | ICD-10-CM | POA: Diagnosis not present

## 2021-11-15 DIAGNOSIS — E282 Polycystic ovarian syndrome: Secondary | ICD-10-CM | POA: Diagnosis not present

## 2021-11-15 DIAGNOSIS — Z8541 Personal history of malignant neoplasm of cervix uteri: Secondary | ICD-10-CM | POA: Diagnosis not present

## 2021-11-15 DIAGNOSIS — F3181 Bipolar II disorder: Secondary | ICD-10-CM | POA: Diagnosis not present

## 2021-11-15 DIAGNOSIS — Z91040 Latex allergy status: Secondary | ICD-10-CM | POA: Insufficient documentation

## 2021-11-15 HISTORY — PX: ROBOTIC ASSISTED LAPAROSCOPIC HYSTERECTOMY AND SALPINGECTOMY: SHX6379

## 2021-11-15 LAB — POCT PREGNANCY, URINE: Preg Test, Ur: NEGATIVE

## 2021-11-15 SURGERY — XI ROBOTIC ASSISTED LAPAROSCOPIC HYSTERECTOMY AND SALPINGECTOMY
Anesthesia: General | Site: Abdomen | Laterality: Bilateral

## 2021-11-15 MED ORDER — KETOROLAC TROMETHAMINE 30 MG/ML IJ SOLN: 30.0000 mg | Freq: Once | INTRAMUSCULAR | Status: DC

## 2021-11-15 MED ORDER — OXYCODONE HCL 5 MG/5ML PO SOLN: 5.0000 mg | Freq: Once | ORAL | Status: DC | PRN

## 2021-11-15 MED ORDER — GABAPENTIN 600 MG PO TABS
600.0000 mg | ORAL_TABLET | Freq: Every day | ORAL | Status: DC
  Administered 2021-11-15: 600 mg via ORAL
  Filled 2021-11-15 (×3): qty 1

## 2021-11-15 MED ORDER — SODIUM CHLORIDE 0.9 % IV SOLN
INTRAVENOUS | Status: DC | PRN
  Administered 2021-11-15: 110 mL

## 2021-11-15 MED ORDER — LACTATED RINGERS IV SOLN: INTRAVENOUS | Status: DC

## 2021-11-15 MED ORDER — ORAL CARE MOUTH RINSE: 15.0000 mL | Freq: Once | OROMUCOSAL | Status: AC

## 2021-11-15 MED ORDER — FENTANYL CITRATE (PF) 250 MCG/5ML IJ SOLN
INTRAMUSCULAR | Status: AC
  Filled 2021-11-15: qty 5

## 2021-11-15 MED ORDER — PROMETHAZINE HCL 25 MG/ML IJ SOLN: 6.2500 mg | INTRAMUSCULAR | Status: DC | PRN

## 2021-11-15 MED ORDER — OXYCODONE HCL 5 MG PO TABS: 5.0000 mg | ORAL_TABLET | Freq: Once | ORAL | Status: DC | PRN

## 2021-11-15 MED ORDER — SENNOSIDES-DOCUSATE SODIUM 8.6-50 MG PO TABS
1.0000 | ORAL_TABLET | Freq: Every evening | ORAL | Status: DC | PRN
  Administered 2021-11-15: 1 via ORAL
  Filled 2021-11-15: qty 1

## 2021-11-15 MED ORDER — MAGNESIUM OXIDE -MG SUPPLEMENT 400 (240 MG) MG PO TABS
200.0000 mg | ORAL_TABLET | Freq: Every day | ORAL | Status: DC
  Administered 2021-11-15 – 2021-11-16 (×2): 200 mg via ORAL
  Filled 2021-11-15 (×2): qty 1

## 2021-11-15 MED ORDER — LIDOCAINE 2% (20 MG/ML) 5 ML SYRINGE
INTRAMUSCULAR | Status: AC
  Filled 2021-11-15: qty 5

## 2021-11-15 MED ORDER — PHENYLEPHRINE HCL-NACL 20-0.9 MG/250ML-% IV SOLN
INTRAVENOUS | Status: DC | PRN
  Administered 2021-11-15: 25 ug/min via INTRAVENOUS

## 2021-11-15 MED ORDER — PROPOFOL 500 MG/50ML IV EMUL
INTRAVENOUS | Status: DC | PRN
  Administered 2021-11-15: 25 ug/kg/min via INTRAVENOUS

## 2021-11-15 MED ORDER — DEXAMETHASONE SODIUM PHOSPHATE 10 MG/ML IJ SOLN
INTRAMUSCULAR | Status: AC
  Filled 2021-11-15: qty 1

## 2021-11-15 MED ORDER — PROPOFOL 10 MG/ML IV BOLUS
INTRAVENOUS | Status: AC
  Filled 2021-11-15: qty 20

## 2021-11-15 MED ORDER — ACETAMINOPHEN 10 MG/ML IV SOLN
INTRAVENOUS | Status: DC | PRN
  Administered 2021-11-15: 1000 mg via INTRAVENOUS

## 2021-11-15 MED ORDER — SODIUM CHLORIDE 0.9 % IR SOLN
Status: DC | PRN
  Administered 2021-11-15: 1

## 2021-11-15 MED ORDER — MIDAZOLAM HCL 5 MG/5ML IJ SOLN
INTRAMUSCULAR | Status: DC | PRN
  Administered 2021-11-15 (×2): 2 mg via INTRAVENOUS

## 2021-11-15 MED ORDER — ONDANSETRON HCL 4 MG/2ML IJ SOLN: 4.0000 mg | Freq: Four times a day (QID) | INTRAMUSCULAR | Status: DC | PRN

## 2021-11-15 MED ORDER — KETOROLAC TROMETHAMINE 30 MG/ML IJ SOLN
30.0000 mg | Freq: Once | INTRAMUSCULAR | Status: AC
  Administered 2021-11-15: 30 mg via INTRAVENOUS
  Filled 2021-11-15: qty 1

## 2021-11-15 MED ORDER — BUPROPION HCL ER (XL) 300 MG PO TB24
300.0000 mg | ORAL_TABLET | Freq: Every day | ORAL | Status: DC
  Administered 2021-11-16: 300 mg via ORAL
  Filled 2021-11-15: qty 1

## 2021-11-15 MED ORDER — TRAZODONE HCL 100 MG PO TABS
100.0000 mg | ORAL_TABLET | Freq: Every day | ORAL | Status: DC
  Administered 2021-11-15: 100 mg via ORAL
  Filled 2021-11-15 (×3): qty 1

## 2021-11-15 MED ORDER — ROCURONIUM BROMIDE 10 MG/ML (PF) SYRINGE
PREFILLED_SYRINGE | INTRAVENOUS | Status: DC | PRN
  Administered 2021-11-15: 100 mg via INTRAVENOUS
  Administered 2021-11-15: 50 mg via INTRAVENOUS

## 2021-11-15 MED ORDER — MIDAZOLAM HCL 2 MG/2ML IJ SOLN
INTRAMUSCULAR | Status: AC
  Filled 2021-11-15: qty 2

## 2021-11-15 MED ORDER — HYDROMORPHONE HCL 1 MG/ML IJ SOLN: 0.2500 mg | INTRAMUSCULAR | Status: DC | PRN

## 2021-11-15 MED ORDER — CEFAZOLIN IN SODIUM CHLORIDE 3-0.9 GM/100ML-% IV SOLN
3.0000 g | INTRAVENOUS | Status: DC
  Filled 2021-11-15: qty 100

## 2021-11-15 MED ORDER — ONDANSETRON HCL 4 MG/2ML IJ SOLN
INTRAMUSCULAR | Status: AC
  Filled 2021-11-15: qty 2

## 2021-11-15 MED ORDER — AMISULPRIDE (ANTIEMETIC) 5 MG/2ML IV SOLN: 10.0000 mg | Freq: Once | INTRAVENOUS | Status: DC | PRN

## 2021-11-15 MED ORDER — TOPIRAMATE 100 MG PO TABS
100.0000 mg | ORAL_TABLET | Freq: Every day | ORAL | Status: DC
  Administered 2021-11-15 – 2021-11-16 (×2): 100 mg via ORAL
  Filled 2021-11-15 (×3): qty 1

## 2021-11-15 MED ORDER — MORPHINE SULFATE (PF) 2 MG/ML IV SOLN
2.0000 mg | Freq: Once | INTRAVENOUS | Status: AC
Start: 2021-11-15 — End: 2021-11-15
  Administered 2021-11-15: 2 mg via INTRAVENOUS
  Filled 2021-11-15: qty 1

## 2021-11-15 MED ORDER — DEXMEDETOMIDINE (PRECEDEX) IN NS 20 MCG/5ML (4 MCG/ML) IV SYRINGE
PREFILLED_SYRINGE | INTRAVENOUS | Status: AC
  Filled 2021-11-15: qty 5

## 2021-11-15 MED ORDER — LIDOCAINE 2% (20 MG/ML) 5 ML SYRINGE
INTRAMUSCULAR | Status: DC | PRN
Start: 2021-11-15 — End: 2021-11-15
  Administered 2021-11-15: 100 mg via INTRAVENOUS

## 2021-11-15 MED ORDER — ACETAMINOPHEN 10 MG/ML IV SOLN
INTRAVENOUS | Status: AC
  Filled 2021-11-15: qty 100

## 2021-11-15 MED ORDER — PHENYLEPHRINE 40 MCG/ML (10ML) SYRINGE FOR IV PUSH (FOR BLOOD PRESSURE SUPPORT)
PREFILLED_SYRINGE | INTRAVENOUS | Status: AC
  Filled 2021-11-15: qty 10

## 2021-11-15 MED ORDER — TRAMADOL HCL 50 MG PO TABS
50.0000 mg | ORAL_TABLET | Freq: Four times a day (QID) | ORAL | Status: DC | PRN
  Administered 2021-11-15 – 2021-11-16 (×2): 50 mg via ORAL
  Filled 2021-11-15 (×2): qty 1

## 2021-11-15 MED ORDER — CHLORHEXIDINE GLUCONATE 0.12 % MT SOLN
OROMUCOSAL | Status: AC
  Administered 2021-11-15: 15 mL via OROMUCOSAL
  Filled 2021-11-15: qty 15

## 2021-11-15 MED ORDER — DEXMEDETOMIDINE (PRECEDEX) IN NS 20 MCG/5ML (4 MCG/ML) IV SYRINGE
PREFILLED_SYRINGE | INTRAVENOUS | Status: DC | PRN
  Administered 2021-11-15 (×2): 8 ug via INTRAVENOUS
  Administered 2021-11-15: 4 ug via INTRAVENOUS

## 2021-11-15 MED ORDER — CHLORHEXIDINE GLUCONATE 0.12 % MT SOLN: 15.0000 mL | Freq: Once | OROMUCOSAL | Status: AC

## 2021-11-15 MED ORDER — DIPHENHYDRAMINE HCL 50 MG/ML IJ SOLN
INTRAMUSCULAR | Status: DC | PRN
  Administered 2021-11-15: 12.5 mg via INTRAVENOUS

## 2021-11-15 MED ORDER — ROCURONIUM BROMIDE 10 MG/ML (PF) SYRINGE
PREFILLED_SYRINGE | INTRAVENOUS | Status: AC
  Filled 2021-11-15: qty 20

## 2021-11-15 MED ORDER — SUGAMMADEX SODIUM 500 MG/5ML IV SOLN
INTRAVENOUS | Status: AC
  Filled 2021-11-15: qty 5

## 2021-11-15 MED ORDER — MORPHINE SULFATE (PF) 2 MG/ML IV SOLN
1.0000 mg | INTRAVENOUS | Status: DC | PRN
  Administered 2021-11-15 – 2021-11-16 (×4): 2 mg via INTRAVENOUS
  Filled 2021-11-15 (×4): qty 1

## 2021-11-15 MED ORDER — PROPOFOL 10 MG/ML IV BOLUS
INTRAVENOUS | Status: DC | PRN
  Administered 2021-11-15: 200 mg via INTRAVENOUS

## 2021-11-15 MED ORDER — ONDANSETRON HCL 4 MG/2ML IJ SOLN
INTRAMUSCULAR | Status: DC | PRN
  Administered 2021-11-15 (×2): 4 mg via INTRAVENOUS

## 2021-11-15 MED ORDER — ONDANSETRON HCL 4 MG PO TABS: 4.0000 mg | ORAL_TABLET | Freq: Four times a day (QID) | ORAL | Status: DC | PRN

## 2021-11-15 MED ORDER — MAGNESIUM HYDROXIDE 400 MG/5ML PO SUSP
15.0000 mL | Freq: Every day | ORAL | Status: DC
  Administered 2021-11-15 – 2021-11-16 (×2): 15 mL via ORAL
  Filled 2021-11-15 (×3): qty 30

## 2021-11-15 MED ORDER — DEXAMETHASONE SODIUM PHOSPHATE 10 MG/ML IJ SOLN
INTRAMUSCULAR | Status: DC | PRN
  Administered 2021-11-15: 10 mg via INTRAVENOUS

## 2021-11-15 MED ORDER — MEPERIDINE HCL 25 MG/ML IJ SOLN: 6.2500 mg | INTRAMUSCULAR | Status: DC | PRN

## 2021-11-15 MED ORDER — FENTANYL CITRATE (PF) 250 MCG/5ML IJ SOLN
INTRAMUSCULAR | Status: DC | PRN
Start: 2021-11-15 — End: 2021-11-15
  Administered 2021-11-15: 100 ug via INTRAVENOUS
  Administered 2021-11-15: 50 ug via INTRAVENOUS
  Administered 2021-11-15: 100 ug via INTRAVENOUS

## 2021-11-15 MED ORDER — SIMETHICONE 80 MG PO CHEW
160.0000 mg | CHEWABLE_TABLET | Freq: Once | ORAL | Status: AC
  Administered 2021-11-15: 160 mg via ORAL
  Filled 2021-11-15: qty 2

## 2021-11-15 SURGICAL SUPPLY — 48 items
APPLICATOR ARISTA FLEXITIP XL (MISCELLANEOUS) ×1 IMPLANT
BAG URINE DRAIN 2000ML AR STRL (UROLOGICAL SUPPLIES) ×1 IMPLANT
CATH FOLEY LF 3WAY 5CC16FR (CATHETERS) ×1 IMPLANT
COVER BACK TABLE 60X90IN (DRAPES) ×2 IMPLANT
COVER TIP SHEARS 8 DVNC (MISCELLANEOUS) ×1 IMPLANT
COVER TIP SHEARS 8MM DA VINCI (MISCELLANEOUS) ×1
DEFOGGER SCOPE WARMER CLEARIFY (MISCELLANEOUS) ×2 IMPLANT
DERMABOND ADHESIVE PROPEN (GAUZE/BANDAGES/DRESSINGS) ×1
DERMABOND ADVANCED .7 DNX6 (GAUZE/BANDAGES/DRESSINGS) IMPLANT
DRAPE ARM DVNC X/XI (DISPOSABLE) ×4 IMPLANT
DRAPE COLUMN DVNC XI (DISPOSABLE) ×1 IMPLANT
DRAPE DA VINCI XI ARM (DISPOSABLE) ×4
DRAPE DA VINCI XI COLUMN (DISPOSABLE) ×1
DRAPE UTILITY 15X26 TOWEL STRL (DRAPES) ×2 IMPLANT
DURAPREP 26ML APPLICATOR (WOUND CARE) ×2 IMPLANT
ELECT REM PT RETURN 9FT ADLT (ELECTROSURGICAL) ×2
ELECTRODE REM PT RTRN 9FT ADLT (ELECTROSURGICAL) ×1 IMPLANT
GAUZE 4X4 16PLY ~~LOC~~+RFID DBL (SPONGE) ×2 IMPLANT
GLOVE SURG ENC TEXT LTX SZ7 (GLOVE) ×6 IMPLANT
GLOVE SURG UNDER POLY LF SZ7 (GLOVE) ×12 IMPLANT
HEMOSTAT ARISTA ABSORB 3G PWDR (HEMOSTASIS) ×1 IMPLANT
HIBICLENS CHG 4% 4OZ BTL (MISCELLANEOUS) ×2 IMPLANT
IRRIGATION STRYKERFLOW (MISCELLANEOUS) ×1 IMPLANT
IRRIGATOR STRYKERFLOW (MISCELLANEOUS) ×2
LEGGING LITHOTOMY PAIR STRL (DRAPES) ×2 IMPLANT
OBTURATOR OPTICAL STANDARD 8MM (TROCAR) ×1
OBTURATOR OPTICAL STND 8 DVNC (TROCAR) ×1
OBTURATOR OPTICALSTD 8 DVNC (TROCAR) IMPLANT
OCCLUDER COLPOPNEUMO (BALLOONS) ×2 IMPLANT
PACK ROBOT WH (CUSTOM PROCEDURE TRAY) ×2 IMPLANT
PACK ROBOTIC GOWN (GOWN DISPOSABLE) ×2 IMPLANT
PACK TRENDGUARD 450 HYBRID PRO (MISCELLANEOUS) IMPLANT
PAD OB MATERNITY 4.3X12.25 (PERSONAL CARE ITEMS) ×2 IMPLANT
PROTECTOR NERVE ULNAR (MISCELLANEOUS) ×3 IMPLANT
SEAL CANN UNIV 5-8 DVNC XI (MISCELLANEOUS) ×4 IMPLANT
SEAL XI 5MM-8MM UNIVERSAL (MISCELLANEOUS) ×4
SEALER VESSEL DA VINCI XI (MISCELLANEOUS) ×1
SEALER VESSEL EXT DVNC XI (MISCELLANEOUS) IMPLANT
SET TRI-LUMEN FLTR TB AIRSEAL (TUBING) ×1 IMPLANT
SUT VIC AB 0 CT1 27 (SUTURE) ×2
SUT VIC AB 0 CT1 27XBRD ANBCTR (SUTURE) ×2 IMPLANT
SUT VICRYL RAPIDE 4/0 PS 2 (SUTURE) ×5 IMPLANT
SUT VLOC 180 0 6IN GS21 (SUTURE) ×1 IMPLANT
TIP UTERINE 6.7X8CM BLUE DISP (MISCELLANEOUS) ×1 IMPLANT
TOWEL GREEN STERILE (TOWEL DISPOSABLE) ×2 IMPLANT
TRENDGUARD 450 HYBRID PRO PACK (MISCELLANEOUS) ×2
TROCAR PORT AIRSEAL 8X120 (TROCAR) ×2 IMPLANT
UNDERPAD 30X36 HEAVY ABSORB (UNDERPADS AND DIAPERS) ×2 IMPLANT

## 2021-11-15 NOTE — Anesthesia Postprocedure Evaluation (Signed)
Anesthesia Post Note  Patient: Nina Morrow  Procedure(s) Performed: XI ROBOTIC ASSISTED LAPAROSCOPIC HYSTERECTOMY AND BILATERAL SALPINGECTOMY (Bilateral: Abdomen)     Patient location during evaluation: PACU Anesthesia Type: General Level of consciousness: awake and alert Pain management: pain level controlled Vital Signs Assessment: post-procedure vital signs reviewed and stable Respiratory status: spontaneous breathing, nonlabored ventilation and respiratory function stable Cardiovascular status: blood pressure returned to baseline and stable Postop Assessment: no apparent nausea or vomiting Anesthetic complications: no   No notable events documented.  Last Vitals:  Vitals:   11/15/21 1139 11/15/21 1219  BP: 133/90 (!) 144/89  Pulse: 95 87  Resp: 18 17  Temp: 36.6 C 36.6 C  SpO2:  94%    Last Pain:  Vitals:   11/15/21 1219  TempSrc: Oral  PainSc:                  Lynda Rainwater

## 2021-11-15 NOTE — Anesthesia Procedure Notes (Signed)
Procedure Name: Intubation Date/Time: 11/15/2021 8:50 AM Performed by: Renato Shin, CRNA Pre-anesthesia Checklist: Patient identified, Emergency Drugs available, Suction available and Patient being monitored Patient Re-evaluated:Patient Re-evaluated prior to induction Oxygen Delivery Method: Circle system utilized Preoxygenation: Pre-oxygenation with 100% oxygen Induction Type: IV induction Ventilation: Mask ventilation without difficulty Laryngoscope Size: Miller and 3 Grade View: Grade I Tube type: Oral Tube size: 7.5 mm Number of attempts: 1 Airway Equipment and Method: Stylet and Oral airway Placement Confirmation: ETT inserted through vocal cords under direct vision, positive ETCO2 and breath sounds checked- equal and bilateral Secured at: 21 cm Tube secured with: Tape Dental Injury: Teeth and Oropharynx as per pre-operative assessment

## 2021-11-15 NOTE — Transfer of Care (Signed)
Immediate Anesthesia Transfer of Care Note  Patient: Nina Morrow  Procedure(s) Performed: XI ROBOTIC ASSISTED LAPAROSCOPIC HYSTERECTOMY AND BILATERAL SALPINGECTOMY (Bilateral: Abdomen)  Patient Location: PACU  Anesthesia Type:General  Level of Consciousness: drowsy and patient cooperative  Airway & Oxygen Therapy: Patient Spontanous Breathing and Patient connected to face mask oxygen  Post-op Assessment: Report given to RN and Post -op Vital signs reviewed and stable  Post vital signs: Reviewed and stable  Last Vitals:  Vitals Value Taken Time  BP 140/87 11/15/21 1052  Temp    Pulse 97 11/15/21 1051  Resp 22 11/15/21 1055  SpO2 93 % 11/15/21 1051  Vitals shown include unvalidated device data.  Last Pain:  Vitals:   11/15/21 0657  TempSrc: Oral  PainSc:          Complications: No notable events documented.

## 2021-11-15 NOTE — Progress Notes (Signed)
GYN Post-Op Note  Subjective:  Patient reports not feeling well. Has right shoulder pain. Feels a pelvic pressure. Has ambulated and voided. Has burped a few times. Planning to eat dinner soon. Has not passed flatus. Reports she has not had a bowel movement in a few days prior to surgery and requests a laxative. Denies fevers, chills, chest pain, SOB, N/V, or bilateral LE edema.  Objective: BP (!) 148/85 (BP Location: Left Arm)    Pulse 82    Temp 97.9 F (36.6 C) (Oral)    Resp 17    Ht 6' (1.829 m)    Wt 132 kg    LMP 11/07/2021 (Exact Date)    SpO2 99%    BMI 39.47 kg/m  Gen:  NAD, pleasant and cooperative Pulm: No increased work of breathing Abd:  Soft, non-distended, appropriately tender throughout, no rebound/guarding, laparoscopic port sites well-healed Ext:  No bilateral LE edema, no bilateral calf tenderness, SCDs in place but not on (patient taking break)  A/P: POD#0 s/p RA-TLH/BS  - Doing well post-operatively - Pulm:  RA - GI:  DAT, milk of magnesia ordered for constipation - Renal:  Voiding spontaneously  - IVF:  LR at 125cc/hour - Pain: S/p IV Morphine - transition to PO Tramadol - DVT Prophylaxis:  SCDs - Activity:  Frequent ambulation - Labs:  None  Drema Dallas, DO

## 2021-11-15 NOTE — Interval H&P Note (Signed)
History and Physical Interval Note:  11/15/2021 8:02 AM  Nina Morrow  has presented today for surgery, with the diagnosis of Abnormal Uterine Bleeding.  The various methods of treatment have been discussed with the patient and family. After consideration of risks, benefits and other options for treatment, the patient has consented to  Procedure(s): XI ROBOTIC ASSISTED LAPAROSCOPIC HYSTERECTOMY AND BILATERAL SALPINGECTOMY (Bilateral), possible unilateral oophorectomy as a surgical intervention. Patient is aware that if one or both ovaries appear abnormal, oophorectomy (unilateral or bilateral) may be indicated. The patient's history has been reviewed, patient examined, no change in status, stable for surgery.  I have reviewed the patient's chart and labs.  Questions were answered to the patient's satisfaction.     Drema Dallas

## 2021-11-15 NOTE — Op Note (Signed)
Pre Op Dx:   1. Abnormal Uterine Bleeding 2. Iron deficiency anemia 3. Failed medical management of AUB 4. History of pulmonary embolism and DVT on anticoagulation  Post Op Dx:   Same as pre-operative diagnoses  Procedure:   Robotic Assisted Total Laparoscopic Hysterectomy and Bilateral Salpingectomy   Surgeon:  Dr. Drema Dallas Assistants:  Dr. Christophe Louis (assistant needed due to the complexity of the anatomy) Anesthesia:  General   EBL:  25cc  IVF:  1000cc UOP:  250cc   Drains:  Foley catheter Specimen removed:  Uterus, cervix, bilateral fallopian tubes - sent to pathology Device(s) implanted: None Case Type:  Clean-contaminated Findings:  Normal-appearing uterus, bilateral fallopian tubes, and ovaries. Bilateral fallopian tubes appeared hyperemic. Abdomen and pelvis free of adhesive disease. Normal-appearing liver contours. Complications: None  Indications:  31 y.o. G0 with history of PE/DVT on anticoagulation with failed medical managemtn of AUB who desired definitive management via hysterectomy.  Description of each procedure:  After informed consent the patient was taken to the operating room and placed in dorsal supine position where general endotracheal anesthesia was administered and found to be adequate.  She was placed in dorsal lithotomy position with her arms tucked.  She was prepped and draped in the usual sterile fashion. A RUMI uterine manipulator with the Koh cup and a Foley catheter were placed.  A timeout was called and the procedure confirmed.   A 54mm supraumbilical incision was made and a trochar was used to enter the abdomen under direct visualization.  Pneumoperitoneum was established and atraumatic entry confirmed. Three additional 71mm ports were placed on either side of the umbilicus and an 86mm port was placed in the left upper quadrant under direct visualization. All port sites were injected with 10cc local anesthetic. The pelvis was bathed in a 60cc  Ropivicaine solution.  The patient was placed in Trendelenburg position and the [employer] robotic device was docked.  Next, attention was turned to the console where the hysterectomy was performed.  The right fallopian tube was divided at the mesosalpinx. The uteroovarian anastamosis was divided and the right round ligament was divided.  This process was repeated on the contralateral side.  The anterior leaflet of the peritoneum was divided to create a bladder flap. The uterine artery and vein were skeletonized and desiccated superior to the Koh cup.  This process was repeated on the contralateral side.  Uterine blanching was observed.  A circumferential colpotomy was created along the ridge of the Koh cup and the uterus was passed off the field.  The vaginal occluder was placed in the vagina to maintain pneumoperitoneum.  The vaginal cuff was then closed with V-loc suture. Hemostasis confirmed. Arista powder was placed on the vaginal cuff and adnexa bilaterally.  The [employer] Vinc[employer] robotic device was undocked and all ports were visualized.   The pneumoperitoneum was reduced completely with the assistance of two deep breaths and all ports were removed.  The skin was closed with 4-0 Vicryl in subcuticular fashion with skin glue placed atop each port site.   The vagina was inspected and there were no vaginal tears noted and no foreign objects remaining in the vagina. The patient was returned to dorsal supine position, awakened and extubated in the OR having appeared to tolerate the procedure well.  All sponge, needle, and instrument counts were correct x 2 at the end of the case.  Disposition:  PACU  Drema Dallas, DO

## 2021-11-16 ENCOUNTER — Encounter (HOSPITAL_COMMUNITY): Payer: Self-pay | Admitting: Obstetrics and Gynecology

## 2021-11-16 DIAGNOSIS — N939 Abnormal uterine and vaginal bleeding, unspecified: Secondary | ICD-10-CM | POA: Diagnosis not present

## 2021-11-16 LAB — CBC WITH DIFFERENTIAL/PLATELET
Abs Immature Granulocytes: 0.01 10*3/uL (ref 0.00–0.07)
Basophils Absolute: 0 10*3/uL (ref 0.0–0.1)
Basophils Relative: 0 %
Eosinophils Absolute: 0 10*3/uL (ref 0.0–0.5)
Eosinophils Relative: 0 %
HCT: 38 % (ref 36.0–46.0)
Hemoglobin: 12.3 g/dL (ref 12.0–15.0)
Immature Granulocytes: 0 %
Lymphocytes Relative: 27 %
Lymphs Abs: 1.9 10*3/uL (ref 0.7–4.0)
MCH: 27.8 pg (ref 26.0–34.0)
MCHC: 32.4 g/dL (ref 30.0–36.0)
MCV: 85.8 fL (ref 80.0–100.0)
Monocytes Absolute: 0.6 10*3/uL (ref 0.1–1.0)
Monocytes Relative: 9 %
Neutro Abs: 4.5 10*3/uL (ref 1.7–7.7)
Neutrophils Relative %: 64 %
Platelets: 266 10*3/uL (ref 150–400)
RBC: 4.43 MIL/uL (ref 3.87–5.11)
RDW: 15.8 % — ABNORMAL HIGH (ref 11.5–15.5)
WBC: 7.1 10*3/uL (ref 4.0–10.5)
nRBC: 0 % (ref 0.0–0.2)

## 2021-11-16 MED ORDER — TRAMADOL HCL 50 MG PO TABS: 50.0000 mg | ORAL_TABLET | Freq: Four times a day (QID) | ORAL | 0 refills | Status: DC | PRN

## 2021-11-16 MED ORDER — BISACODYL 10 MG RE SUPP
10.0000 mg | Freq: Once | RECTAL | Status: AC
  Administered 2021-11-16: 10 mg via RECTAL
  Filled 2021-11-16: qty 1

## 2021-11-16 MED ORDER — SIMETHICONE 80 MG PO CHEW
160.0000 mg | CHEWABLE_TABLET | Freq: Four times a day (QID) | ORAL | Status: DC
  Administered 2021-11-16 (×2): 160 mg via ORAL
  Filled 2021-11-16 (×2): qty 2

## 2021-11-16 MED ORDER — FLEET ENEMA 7-19 GM/118ML RE ENEM
1.0000 | ENEMA | Freq: Once | RECTAL | Status: AC
  Administered 2021-11-16: 1 via RECTAL

## 2021-11-16 MED ORDER — TRAMADOL HCL 50 MG PO TABS
50.0000 mg | ORAL_TABLET | Freq: Four times a day (QID) | ORAL | Status: DC
  Administered 2021-11-16 (×2): 50 mg via ORAL
  Filled 2021-11-16 (×2): qty 1

## 2021-11-16 NOTE — Progress Notes (Signed)
Patient called RN to room due to small amount of serosanguinous drainage from left medial port site. 2x2 dressing and paper tape applied. MD made aware. Patient stated positive BM and flatus. I educated patient about the importance of ambulation.

## 2021-11-16 NOTE — Progress Notes (Signed)
Pt given discharge instructions and all questions answered. Pt verbalized understanding.

## 2021-11-16 NOTE — Progress Notes (Signed)
Called into patient room. Patient has been resting. She had a Fleet enema today which resulted in a small bowel movement. She then had a Dulcolax suppository which resulted in more flatus (no more bowel movements) but this helped to relieve the gas pain. Counseled patient that she has met all criteria for discharge home. Discharge instructions reviewed. Patient to restart Xarelto this evening at home. F/u at initial post-operative visit in 3 weeks or sooner PRN.  Drema Dallas, DO

## 2021-11-16 NOTE — Progress Notes (Signed)
GYN Progress Note   Subjective:  Patient reports bilateral shoulder pain. Reports abdominal pressure. Milk of Magnesia did not help to result in bowel movement last night. Having difficulty with walking due to the discomfort. Open to trying an enema to aid with having a bowel movement. She has passed minimal flatus. Has not really eaten due to not having an appetite but her significant other brought food to the room from [facility]. Denies fevers, chills, chest pain, SOB, N/V.   Objective: BP (!) 142/81 (BP Location: Left Arm)    Pulse 78    Temp 98.7 F (37.1 C) (Oral)    Resp 18    Ht 6' (1.829 m)    Wt 132 kg    LMP 11/07/2021 (Exact Date)    SpO2 97%    BMI 39.47 kg/m  Gen:  NAD, pleasant and cooperative Cardio:  RRR Pulm: CTAB, no wheezes/rales/rhonchi Abd:  Normoactive bowel sounds, soft, non-distended, appropriately tender throughout, no rebound/guarding, laparoscopic port sites well-healed Ext:  No bilateral LE edema, no bilateral calf tenderness   A/P: POD#1 s/p RA-TLH/BS  Routine post-operative care - Doing well post-operatively - Pulm:  RA - GI:  DAT - Renal:  Voiding spontaneously  - IVF:  LR at 125cc/hour --> SLIV - Pain: Tramadol/Tylenol - DVT Prophylaxis:  SCDs - Activity:  Frequent ambulation - Labs:  CBC ordered this AM due to persistent pain to rule out elevated WBC and anemia  Constipation - Suspect discomfort is secondary to gas and constipation - Milk of magnesia was not successful, patient was told not to take Miralax due to bariatric surgery - Will try Fleet enema today and Mylicon scheduled - Continue frequent ambulation  Disposition: Pending meeting post-operative goals today.    Drema Dallas, DO

## 2021-11-16 NOTE — Discharge Summary (Signed)
Physician Discharge Summary  Patient ID: Nina Morrow MRN: [id number] DOB/AGE: 1991/05/20 31 y.o.  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 Prior to hospital discharge, patient was tolerating PO, ambulating, voiding spontaneously, passing flatus, and pain was well-controlled. She had a Fleet enema and Dulcolax suppository prior to discharge. See hospital chart for specific details. Patient was discharged home in stable condition.   Consults: None  Significant Diagnostic Studies: labs: CBC    Component Value Date/Time   WBC 7.1 11/16/2021 0803   RBC 4.43 11/16/2021 0803   HGB 12.3 11/16/2021 0803   HGB 12.7 11/03/2021 0832   HCT 38.0 11/16/2021 0803   PLT 266 11/16/2021 0803   PLT 273 11/03/2021 0832   MCV 85.8 11/16/2021 0803   MCH 27.8 11/16/2021 0803   MCHC 32.4 11/16/2021 0803   RDW 15.8 (H) 11/16/2021 0803   LYMPHSABS 1.9 11/16/2021 0803   MONOABS 0.6 11/16/2021 0803   EOSABS 0.0 11/16/2021 0803   BASOSABS 0.0 11/16/2021 0803     Treatments: surgery: See above  Discharge Exam: Blood pressure (!) 142/81, pulse 78, temperature 98.7 F (37.1 C), temperature source Oral, resp. rate 18, height 6' (1.829 m), weight 132 kg, last menstrual period 11/07/2021, SpO2 97 %. Gen:  NAD, pleasant and cooperative Cardio:  RRR Pulm: CTAB, no wheezes/rales/rhonchi Abd:  Normoactive bowel sounds, soft, non-distended, appropriately tender throughout, no rebound/guarding, laparoscopic port sites well-healed Ext:   No bilateral LE edema, no bilateral calf tenderness  Disposition:    Allergies as of 11/16/2021       Reactions   Aripiprazole Shortness Of Breath, Diarrhea, Nausea And Vomiting   Light headed   Dust Mite Extract Other (See Comments)   Sneezing   Grass Extracts [gramineae Pollens] Cough   sneezing   Latex Hives, Rash        Medication List     STOP taking these medications    norethindrone 0.35 MG tablet Commonly known as: MICRONOR       TAKE these medications    albuterol 108 (90 Base) MCG/ACT inhaler Commonly known as: VENTOLIN HFA Inhale 2 puffs into the lungs every 6 (six) hours as needed for wheezing or shortness of breath. prn   buPROPion 300 MG 24 hr tablet Commonly known as: WELLBUTRIN XL Take 300 mg by mouth daily.   estradiol 0.1 MG/GM vaginal cream Commonly known as: ESTRACE Place 1 Applicatorful vaginally 2 (two) times a week.   gabapentin 600 MG tablet Commonly known as: NEURONTIN Take 600 mg by mouth at bedtime.   Magnesium Oxide 200 MG Tabs Take 200 mg by mouth daily.   rivaroxaban 20 MG Tabs tablet Commonly known as: XARELTO Take 1 tablet (20 mg total) by mouth daily with supper.   topiramate 100 MG tablet Commonly known as: TOPAMAX Take 100 mg by mouth daily.   traMADol 50 MG tablet Commonly known as: ULTRAM Take 1 tablet (50 mg total) by mouth every 6 (six) hours as needed for moderate pain or severe pain.   traZODone 100 MG tablet Commonly known as: DESYREL Take 100 mg by mouth at bedtime.  Follow-up Information     Drema Dallas, DO Follow up in 3 week(s).   Specialty: Obstetrics and Gynecology Why: Please keep your post-operative visits as previously scheduled. Contact information: [address] [place] [address] [phone]                 Signed: Drema Dallas 11/16/2021, 8:03 AM

## 2021-11-17 LAB — SURGICAL PATHOLOGY

## 2021-12-07 ENCOUNTER — Other Ambulatory Visit: Payer: Self-pay | Admitting: Physician Assistant

## 2021-12-07 DIAGNOSIS — D508 Other iron deficiency anemias: Secondary | ICD-10-CM

## 2021-12-07 DIAGNOSIS — D500 Iron deficiency anemia secondary to blood loss (chronic): Secondary | ICD-10-CM

## 2021-12-07 DIAGNOSIS — E538 Deficiency of other specified B group vitamins: Secondary | ICD-10-CM

## 2021-12-08 ENCOUNTER — Inpatient Hospital Stay: Payer: Managed Care, Other (non HMO) | Attending: Physician Assistant

## 2021-12-08 ENCOUNTER — Other Ambulatory Visit: Payer: Self-pay

## 2021-12-08 ENCOUNTER — Telehealth: Payer: Self-pay

## 2021-12-08 ENCOUNTER — Inpatient Hospital Stay (HOSPITAL_BASED_OUTPATIENT_CLINIC_OR_DEPARTMENT_OTHER): Payer: Managed Care, Other (non HMO) | Admitting: Physician Assistant

## 2021-12-08 VITALS — BP 129/98 | HR 104 | Temp 98.2°F | Resp 18 | Ht 72.0 in | Wt 298.1 lb

## 2021-12-08 DIAGNOSIS — K9089 Other intestinal malabsorption: Secondary | ICD-10-CM | POA: Diagnosis not present

## 2021-12-08 DIAGNOSIS — Z86718 Personal history of other venous thrombosis and embolism: Secondary | ICD-10-CM | POA: Insufficient documentation

## 2021-12-08 DIAGNOSIS — Z800 Family history of malignant neoplasm of digestive organs: Secondary | ICD-10-CM | POA: Insufficient documentation

## 2021-12-08 DIAGNOSIS — I2699 Other pulmonary embolism without acute cor pulmonale: Secondary | ICD-10-CM | POA: Diagnosis not present

## 2021-12-08 DIAGNOSIS — Z803 Family history of malignant neoplasm of breast: Secondary | ICD-10-CM | POA: Insufficient documentation

## 2021-12-08 DIAGNOSIS — D508 Other iron deficiency anemias: Secondary | ICD-10-CM

## 2021-12-08 DIAGNOSIS — E538 Deficiency of other specified B group vitamins: Secondary | ICD-10-CM | POA: Diagnosis not present

## 2021-12-08 DIAGNOSIS — Z79899 Other long term (current) drug therapy: Secondary | ICD-10-CM | POA: Diagnosis not present

## 2021-12-08 DIAGNOSIS — Z9884 Bariatric surgery status: Secondary | ICD-10-CM | POA: Diagnosis not present

## 2021-12-08 DIAGNOSIS — K5900 Constipation, unspecified: Secondary | ICD-10-CM | POA: Diagnosis not present

## 2021-12-08 DIAGNOSIS — D500 Iron deficiency anemia secondary to blood loss (chronic): Secondary | ICD-10-CM | POA: Insufficient documentation

## 2021-12-08 DIAGNOSIS — Z87891 Personal history of nicotine dependence: Secondary | ICD-10-CM | POA: Diagnosis not present

## 2021-12-08 DIAGNOSIS — N920 Excessive and frequent menstruation with regular cycle: Secondary | ICD-10-CM | POA: Diagnosis not present

## 2021-12-08 DIAGNOSIS — I8290 Acute embolism and thrombosis of unspecified vein: Secondary | ICD-10-CM

## 2021-12-08 DIAGNOSIS — Z801 Family history of malignant neoplasm of trachea, bronchus and lung: Secondary | ICD-10-CM | POA: Insufficient documentation

## 2021-12-08 DIAGNOSIS — Z8249 Family history of ischemic heart disease and other diseases of the circulatory system: Secondary | ICD-10-CM | POA: Diagnosis not present

## 2021-12-08 DIAGNOSIS — Z832 Family history of diseases of the blood and blood-forming organs and certain disorders involving the immune mechanism: Secondary | ICD-10-CM | POA: Diagnosis not present

## 2021-12-08 LAB — CBC WITH DIFFERENTIAL (CANCER CENTER ONLY)
Abs Immature Granulocytes: 0.01 10*3/uL (ref 0.00–0.07)
Basophils Absolute: 0 10*3/uL (ref 0.0–0.1)
Basophils Relative: 1 %
Eosinophils Absolute: 0.2 10*3/uL (ref 0.0–0.5)
Eosinophils Relative: 4 %
HCT: 39.4 % (ref 36.0–46.0)
Hemoglobin: 12.9 g/dL (ref 12.0–15.0)
Immature Granulocytes: 0 %
Lymphocytes Relative: 47 %
Lymphs Abs: 2.2 10*3/uL (ref 0.7–4.0)
MCH: 27.6 pg (ref 26.0–34.0)
MCHC: 32.7 g/dL (ref 30.0–36.0)
MCV: 84.4 fL (ref 80.0–100.0)
Monocytes Absolute: 0.4 10*3/uL (ref 0.1–1.0)
Monocytes Relative: 9 %
Neutro Abs: 1.8 10*3/uL (ref 1.7–7.7)
Neutrophils Relative %: 39 %
Platelet Count: 269 10*3/uL (ref 150–400)
RBC: 4.67 MIL/uL (ref 3.87–5.11)
RDW: 15.3 % (ref 11.5–15.5)
WBC Count: 4.6 10*3/uL (ref 4.0–10.5)
nRBC: 0 % (ref 0.0–0.2)

## 2021-12-08 LAB — IRON AND IRON BINDING CAPACITY (CC-WL,HP ONLY)
Iron: 38 ug/dL (ref 28–170)
Saturation Ratios: 9 % — ABNORMAL LOW (ref 10.4–31.8)
TIBC: 410 ug/dL (ref 250–450)
UIBC: 372 ug/dL (ref 148–442)

## 2021-12-08 LAB — VITAMIN B12: Vitamin B-12: 455 pg/mL (ref 180–914)

## 2021-12-08 LAB — FERRITIN: Ferritin: 19 ng/mL (ref 11–307)

## 2021-12-08 NOTE — Telephone Encounter (Signed)
Pt advised with VU 

## 2021-12-08 NOTE — Progress Notes (Signed)
[facility] Telephone:([phone]) [phone]   [phone]  PROGRESS NOTE  Patient Care Team: Trey Sailors, PA as PCP - General (Physician Assistant)  Hematological/Oncological History #Recurrent DVTs/PE: 1) 06/2010: Right leg DVT and PE treated with warfarin x 3 months-felt to be provoked by OCPs 2) 2015: Left Leg DVT. Felt to be provoked by OCPs. Recommended indefinite AC and treated with warfarin. Patient self-discontinued after 68months 3) 12/2017: Right leg DVT and PE treated with warfarin-no provoking factor since not on OCPS.  4) 03/14/2020: Checked Factor V Leiden mutation, Prothrombin Mutation, Lupus anticoagulant-Not present 5) 03/30/2020: Switch to Eliquis.  6) 10/2020: Discontinued Eliquis due to cost.  7) 02/02/2021: Establish care with [name] PA-C. Recommend indefinite anticoagulation with Xarelto.   #Iron deficiency anemia: 1) 01/20/2021: Iron panel from [facility] revealed IDA with total iron 36, iron saturation 8%, ferritin 4.  2) 02/17/2021-04/14/2021: Received IV venofer x 5 doses.  3) 05/30/2021-08/11/2021: Received IV venofer x 3 doses 4)09/08/2021-11/03/2021: Received IV venofer x 3 doses.   #Vitamin B12 deficiency: 1)05/30/2021-Present: Started once monthly vitamin B12 injections  CHIEF COMPLAINT: -History of multiple DVT and PE -Iron deficiency anemia  -Vitamin B12 deficiency  HISTORY OF PRESENTING ILLNESS:  Nina Morrow 31 y.o. female who returns for a follow up for history of DVTs/PE while on Xarelto, iron deficiency and vitamin B12 deficiency. Since the last visit on 11/03/2021, she continues to received monthly B12 injection and received IV venofer 200 mg x 1 dose. Additionally, she underwent robotic assisted laparoscopic hysterectomy and bilateral salpingectomy on 11/15/2021.   On exam today, Nina Morrow reports marked improvement of her energy levels. She is more active and is able to complete all her daily activities on her own.  She continues to have a good appetite without any dietary restrictions. She denies any nausea, vomiting or abdominal pain. She has constipation with a bowel movement once a week. She takes OTC laxative teas and milk of magnesia as needed with minimal improvement. She denies easy bruising or signs of bleeding. She denies any vaginal bleeding since  undergoing hysterectomy. She denies fevers, chills, night sweats, shortness of breath, chest pain or cough. She has no other complaints. Rest of the 10 point ROS is below.   MEDICAL HISTORY:  Past Medical History:  Diagnosis Date   Anemia    Anxiety    Bipolar 1 disorder ([facility])    with depression and anxiety. stopped taking lithium december 2021. Pt states misdiagnosis, she actually has BPD.   Cancer ([facility])    Cervical   Depression    DVT (deep venous thrombosis) (HCC)    GERD (gastroesophageal reflux disease)    Hallucination    Headache    Palpitation    PCOS (polycystic ovarian syndrome)    Pneumonia    Pulmonary emboli (HCC)    Syncope and collapse     SURGICAL HISTORY: Past Surgical History:  Procedure Laterality Date   BARIATRIC SURGERY  10/01/2019   laparoscopic sleeve gastrectomy   CERVICAL CONIZATION W/BX N/A 07/11/2021   Procedure: CONIZATION CERVIX WITH BIOPSY;  Surgeon: Drema Dallas, DO;  Location: [facility];  Service: Gynecology;  Laterality: N/A;   HYSTEROSCOPY WITH D & C N/A 07/11/2021   Procedure: DILATATION AND CURETTAGE /HYSTEROSCOPY WITH MYOSURE;  Surgeon: Drema Dallas, DO;  Location: [facility];  Service: Gynecology;  Laterality: N/A;   ROBOTIC ASSISTED LAPAROSCOPIC HYSTERECTOMY AND SALPINGECTOMY Bilateral 11/15/2021   Procedure: XI ROBOTIC ASSISTED LAPAROSCOPIC HYSTERECTOMY AND BILATERAL SALPINGECTOMY;  Surgeon:  Drema Dallas, DO;  Location: [facility];  Service: Gynecology;  Laterality: Bilateral;   TONSILLECTOMY     TONSILLECTOMY AND ADENOIDECTOMY      SOCIAL HISTORY: Social History   Socioeconomic History   Marital  status: Significant Other    Spouse name: Not on file   Number of children: Not on file   Years of education: Not on file   Highest education level: Not on file  Occupational History   Not on file  Tobacco Use   Smoking status: Former    Years: 2.00    Types: E-cigarettes, Cigarettes, Cigars    Quit date: 02/04/2012    Years since quitting: 9.8   Smokeless tobacco: Never   Tobacco comments:    Used to smoke 1 pack a month  Vaping Use   Vaping Use: Former  Substance and Sexual Activity   Alcohol use: Not Currently   Drug use: Not Currently    Types: Marijuana    Comment: vapes TH; occasional mushrooms   Sexual activity: Not on file  Other Topics Concern   Not on file  Social History Narrative   Not on file   Social Determinants of Health   Financial Resource Strain: Not on file  Food Insecurity: Not on file  Transportation Needs: Not on file  Physical Activity: Not on file  Stress: Not on file  Social Connections: Not on file  Intimate Partner Violence: Not on file    FAMILY HISTORY: Family History  Problem Relation Age of Onset   Transient ischemic attack Mother        2015   Pulmonary embolism Sister        May 2021   Breast cancer Maternal Grandmother    Stomach cancer Maternal Grandfather    Lung cancer Maternal Grandfather    Breast cancer Cousin     ALLERGIES:  is allergic to aripiprazole, dust mite extract, grass extracts [gramineae pollens], and latex.  MEDICATIONS:  Current Outpatient Medications  Medication Sig Dispense Refill   albuterol (VENTOLIN HFA) 108 (90 Base) MCG/ACT inhaler Inhale 2 puffs into the lungs every 6 (six) hours as needed for wheezing or shortness of breath. prn     busPIRone (BUSPAR) 15 MG tablet Take 15 mg by mouth 2 (two) times daily.     estradiol (ESTRACE) 0.1 MG/GM vaginal cream Place 1 Applicatorful vaginally 2 (two) times a week.     gabapentin (NEURONTIN) 600 MG tablet Take 600 mg by mouth at bedtime.     Magnesium  Oxide 200 MG TABS Take 200 mg by mouth daily.     rivaroxaban (XARELTO) 20 MG TABS tablet Take 1 tablet (20 mg total) by mouth daily with supper. 30 tablet 3   traZODone (DESYREL) 100 MG tablet Take 100 mg by mouth at bedtime.     buPROPion (WELLBUTRIN XL) 300 MG 24 hr tablet Take 300 mg by mouth daily. (Patient not taking: Reported on 12/08/2021)     topiramate (TOPAMAX) 100 MG tablet Take 100 mg by mouth daily. (Patient not taking: Reported on 12/08/2021)     traMADol (ULTRAM) 50 MG tablet Take 1 tablet (50 mg total) by mouth every 6 (six) hours as needed for moderate pain or severe pain. (Patient not taking: Reported on 12/08/2021) 30 tablet 0   No current facility-administered medications for this visit.    REVIEW OF SYSTEMS:   Constitutional: ( - ) fevers, ( - )  chills , (  ) night sweats Eyes: ( - )  blurriness of vision, ( - ) double vision, ( - ) watery eyes Ears, nose, mouth, throat, and face: ( - ) mucositis, ( - ) sore throat Respiratory: ( - ) cough, ( - ) dyspnea, ( - ) wheezes Cardiovascular: ( - ) palpitation, ( - ) chest discomfort, ( - ) lower extremity swelling Gastrointestinal:  (- ) nausea, ( - ) heartburn, ( - ) change in bowel habits Skin: ( - ) abnormal skin rashes Lymphatics: ( - ) new lymphadenopathy, ( - ) easy bruising Neurological: ( - ) numbness, ( - ) tingling, ( - ) new weaknesses Behavioral/Psych: ( - ) mood change, ( - ) new changes  All other systems were reviewed with the patient and are negative.  PHYSICAL EXAMINATION: ECOG PERFORMANCE STATUS: 1 - Symptomatic but completely ambulatory  Vitals:   12/08/21 1053  BP: (!) 129/98  Pulse: (!) 104  Resp: 18  Temp: 98.2 F (36.8 C)  SpO2: 99%   Filed Weights   12/08/21 1053  Weight: 298 lb 1.6 oz (135.2 kg)    GENERAL: well appearing african [place] female in NAD  SKIN: skin color, texture, turgor are normal, no rashes or significant lesions EYES: conjunctiva are pink and non-injected, sclera  clear OROPHARYNX: no exudate, no erythema; lips, buccal mucosa, and tongue normal  LUNGS: clear to auscultation and percussion with normal breathing effort HEART: regular rate & rhythm and no murmurs and no lower extremity edema ABDOMEN: soft, non-tender, non-distended, normal bowel sounds Musculoskeletal: no cyanosis of digits and no clubbing. No erythema or edema in lower extremities.  PSYCH: alert & oriented x 3, fluent speech NEURO: no focal motor/sensory deficits  LABORATORY DATA:  I have reviewed the data as listed CBC Latest Ref Rng & Units 12/08/2021 11/16/2021 11/13/2021  WBC 4.0 - 10.5 K/uL 4.6 7.1 4.5  Hemoglobin 12.0 - 15.0 g/dL 12.9 12.3 13.0  Hematocrit 36.0 - 46.0 % 39.4 38.0 41.8  Platelets 150 - 400 K/uL 269 266 278    CMP Latest Ref Rng & Units 11/13/2021 10/31/2021 06/26/2021  Glucose 70 - 99 mg/dL 90 81 89  BUN 6 - 20 mg/dL 6 9 9   Creatinine 0.44 - 1.00 mg/dL 0.95 0.93 0.83  Sodium 135 - 145 mmol/L 139 134(L) 141  Potassium 3.5 - 5.1 mmol/L 4.2 3.7 3.9  Chloride 98 - 111 mmol/L 101 102 108  CO2 22 - 32 mmol/L 29 23 27   Calcium 8.9 - 10.3 mg/dL 9.1 9.6 8.4(L)  Total Protein 6.5 - 8.1 g/dL 7.7 - 6.7  Total Bilirubin 0.3 - 1.2 mg/dL 0.5 - 0.2(L)  Alkaline Phos 38 - 126 U/L 77 - 61  AST 15 - 41 U/L 18 - 17  ALT 0 - 44 U/L 18 - 18   ASSESSMENT & PLAN Nina Morrow is a 31 y.o. female returns for a follow up for history of recurrent DVT/PE along with vitamin B12 and iron deficiency anemia.   #Multiple DVT's and PE's: --Hypercoagulable workup was completed by [facility] in May 2021 which included Factor V Leiden mutation, Prothrombin Mutation, Lupus anticoagulant, all three were unremarkable.  -Remaining hypercoagulable workup was done here on 02/03/2021 checking protein C and protein S activity, beta-2 glycoprotein antibody and cardiolipin antibody. Findings were unremarkable except for mild Protein S activity.  --Recommend indefinite anticoagulation due  to recurrent DVT/PEs.  --Currently on Xarelto with good tolerance. Patient is eligible for maintenance dose of Xarelto at 10 mg once daily. Patient just filled her  3 month supply of Xarelto so we will transition to maintenance dose at next refill.    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  --Patient recently currently on progesterone containing birth control pills.   --Patient underwent Cold Knife Conization of the cervix on 07/11/2021 and underwent hysterectomy on 11/15/2021.  --Received once a week IV venofer x 11 doses since 02/17/2021.  --Labs today show no evidence of anemia with Hgb of 12.9. Iron panel shows persistent deficiency with serum iron 38, ferritin 19, saturation 9%. --Proceed with IV venofer 200 mg once weekly x 3 doses to help bolster iron levels.   #B12 deficiency: --Labs today show vitamin B12  levels stable at 455.  --Secondary to gastric sleeve surgery.  --Patient will proceed monthly IM injections.   Follow up: --IV venofer 200 mg once a week x 3 doses.  --Continue with once a month B12 injections x 3 doses --RTC in 3 months with repeat labs   Orders Placed This Encounter  Procedures   CBC with Differential ([facility] Only)    Standing Status:   Future    Standing Expiration Date:   12/11/2022   Ferritin    Standing Status:   Future    Standing Expiration Date:   12/11/2022   Iron and Iron Binding Capacity ([facility] only)    Standing Status:   Future    Standing Expiration Date:   12/11/2022   Vitamin B12    Standing Status:   Future    Standing Expiration Date:   12/11/2022   Methylmalonic acid, serum    Standing Status:   Future    Standing Expiration Date:   12/11/2022    All questions were answered. The patient knows to call the clinic with any problems, questions or  concerns.  I have spent a total of 30 minutes minutes of face-to-face and non-face-to-face time, preparing to see the patient, obtaining and/or reviewing separately obtained history, performing a medically appropriate examination, counseling and educating the patient, ordering medications/tests, documenting clinical information in the electronic health record, and care coordination.   [name], PA-C Department of Hematology/Oncology [place] at [facility] Phone: [phone]

## 2021-12-08 NOTE — Telephone Encounter (Signed)
-----   Message from [name], PA-C sent at 12/08/2021  1:45 PM EST ----- Please notify patient that her iron saturation is still low so we will arrange for IV iron. Vitamin B12 is stable and within normal limits. Advise to continue with monthly B12 injections.

## 2021-12-11 ENCOUNTER — Encounter: Payer: Self-pay | Admitting: Physician Assistant

## 2021-12-11 ENCOUNTER — Telehealth: Payer: Self-pay | Admitting: Hematology and Oncology

## 2021-12-11 NOTE — Telephone Encounter (Signed)
Scheduled per 2/10 los, message has been left with pt

## 2021-12-12 LAB — METHYLMALONIC ACID, SERUM: Methylmalonic Acid, Quantitative: 157 nmol/L (ref 0–378)

## 2021-12-15 MED FILL — Iron Sucrose Inj 20 MG/ML (Fe Equiv): INTRAVENOUS | Qty: 10 | Status: AC

## 2021-12-16 ENCOUNTER — Inpatient Hospital Stay: Payer: Managed Care, Other (non HMO)

## 2021-12-16 ENCOUNTER — Other Ambulatory Visit: Payer: Self-pay

## 2021-12-16 VITALS — BP 123/91 | HR 85 | Temp 97.8°F | Resp 16

## 2021-12-16 DIAGNOSIS — D500 Iron deficiency anemia secondary to blood loss (chronic): Secondary | ICD-10-CM

## 2021-12-16 DIAGNOSIS — Z86718 Personal history of other venous thrombosis and embolism: Secondary | ICD-10-CM | POA: Diagnosis not present

## 2021-12-16 MED ORDER — CYANOCOBALAMIN 1000 MCG/ML IJ SOLN
1000.0000 ug | Freq: Once | INTRAMUSCULAR | Status: AC
  Administered 2021-12-16: 1000 ug via INTRAMUSCULAR
  Filled 2021-12-16: qty 1

## 2021-12-16 MED ORDER — SODIUM CHLORIDE 0.9 % IV SOLN
200.0000 mg | Freq: Once | INTRAVENOUS | Status: AC
  Administered 2021-12-16: 200 mg via INTRAVENOUS
  Filled 2021-12-16: qty 200

## 2021-12-16 NOTE — Patient Instructions (Signed)
[facility]  Discharge Instructions: Thank you for choosing [facility] to provide your oncology and hematology care.   If you have a lab appointment with the [facility], please go directly to the [facility] and check in at the registration area.   Wear comfortable clothing and clothing appropriate for easy access to any Portacath or PICC line.   We strive to give you quality time with your provider. You may need to reschedule your appointment if you arrive late (15 or more minutes).  Arriving late affects you and other patients whose appointments are after yours.  Also, if you miss three or more appointments without notifying the office, you may be dismissed from the clinic at the providers discretion.      For prescription refill requests, have your pharmacy contact our office and allow 72 hours for refills to be completed.    Today you received the following chemotherapy and/or immunotherapy agents Venofer      To help prevent nausea and vomiting after your treatment, we encourage you to take your nausea medication as directed.  BELOW ARE SYMPTOMS THAT SHOULD BE REPORTED IMMEDIATELY: *FEVER GREATER THAN 100.4 F (38 C) OR HIGHER *CHILLS OR SWEATING *NAUSEA AND VOMITING THAT IS NOT CONTROLLED WITH YOUR NAUSEA MEDICATION *UNUSUAL SHORTNESS OF BREATH *UNUSUAL BRUISING OR BLEEDING *URINARY PROBLEMS (pain or burning when urinating, or frequent urination) *BOWEL PROBLEMS (unusual diarrhea, constipation, pain near the anus) TENDERNESS IN MOUTH AND THROAT WITH OR WITHOUT PRESENCE OF ULCERS (sore throat, sores in mouth, or a toothache) UNUSUAL RASH, SWELLING OR PAIN  UNUSUAL VAGINAL DISCHARGE OR ITCHING   Items with * indicate a potential emergency and should be followed up as soon as possible or go to the Emergency Department if any problems should occur.  Please show the CHEMOTHERAPY ALERT CARD or IMMUNOTHERAPY ALERT CARD at check-in to the  Emergency Department and triage nurse.  Should you have questions after your visit or need to cancel or reschedule your appointment, please contact [facility]  Dept: [phone]  and follow the prompts.  Office hours are 8:00 a.m. to 4:30 p.m. Monday - Friday. Please note that voicemails left after 4:00 p.m. may not be returned until the following business day.  We are closed weekends and major holidays. You have access to a nurse at all times for urgent questions. Please call the main number to the clinic Dept: [phone] and follow the prompts.   For any non-urgent questions, you may also contact your provider using MyChart. We now offer e-Visits for anyone 27 and older to request care online for non-urgent symptoms. For details visit [email or web address].   Also download the MyChart app! Go to the app store, search "MyChart", open the app, select [facility], and log in with your MyChart username and password.  Due to Covid, a mask is required upon entering the hospital/clinic. If you do not have a mask, one will be given to you upon arrival. For doctor visits, patients may have 1 support person aged 27 or older with them. For treatment visits, patients cannot have anyone with them due to current Covid guidelines and our immunocompromised population.

## 2021-12-16 NOTE — Patient Instructions (Signed)
Vitamin B12 and Folate Test Why am I having this test? Vitamin Z12 and folate (folic acid) are B vitamins needed to make red blood cells and keep your nervous system healthy. Vitamin B12 is in foods such as meats, eggs, dairy products, and fish. Folate is in fruits, beans, and leafy green vegetables. Some foods, such as whole grains, bread, and cereals have vitamin B12 added to them (are fortified). You may not have enough of these B vitamins (have a deficiency) if your diet lacks these vitamins. Low levels can also be caused by diseases or having had surgeries on your stomach or small intestine that interfere with your ability to absorb the vitamins from your food. You may have a vitamin B12 and folate test if: You have symptoms of vitamin B12 or folate deficiency, such as tiredness (fatigue), headache, confusion, poor balance, or tingling and numbness in your hands and feet. You are pregnant or breastfeeding. Women who are pregnant or breastfeeding need more folate and may need to take dietary supplements. Your red blood cell count is low (anemia). You are an older person and have mental confusion. You have a disease or condition that may lead to a deficiency of these B vitamins. What is being tested? This test measures the amount of vitamin B12 and folate in your blood. The tests for vitamin B12 and folate may be done together or separately. What kind of sample is taken? A blood sample is required for this test. It is usually collected by inserting a needle into a blood vessel. How do I prepare for this test? Follow instructions from your health care provider about eating and drinking before the test. Tell a health care provider about: All medicines you are taking, including vitamins, herbs, eye drops, creams, and over-the-counter medicines. Any medical conditions you have. Whether you are pregnant or may be pregnant. How often you drink alcohol. How are the results reported? Your test  results will be reported as values that identify the amount of vitamin B12 and folate in your blood. Your health care provider will compare your results to normal ranges that were established after testing a large group of people (reference ranges). Reference ranges may vary among labs and hospitals. For this test, common reference ranges are: Vitamin B12: 160-950 pg/mL or 118-701 pmol/L (SI units). Folate: 5-25 ng/mL or 11-57 nmol/L (SI units). What do the results mean? Results within the reference range are considered normal. Vitamin B12 or folate levels that are lower than the reference range may be caused by: Poor nutrition or eating a vegetarian or vegan diet that does not include any foods that come from animals. Having alcoholism. Having certain diseases that make it hard to absorb vitamin B12. These diseases include Crohn's disease, chronic pancreatitis, and cystic fibrosis. Taking certain medicines. Having had surgeries on your stomach or small intestine. High levels of vitamin B12 are rare, but they may happen if you have: Cancer. Liver disease. High levels of folate may happen if: You have anemia. You are vegetarian. You have had a recent blood transfusion. Talk with your health care provider about what your results mean. Questions to ask your health care provider Ask your health care provider, or the department that is doing the test: When will my results be ready? How will I get my results? What are my treatment options? What other tests do I need? What are my next steps? Summary Vitamin W58 and folate (folic acid) are both B vitamins that are needed to make  red blood cells and to keep your nervous system healthy. You may not have enough B vitamins in your body if you do not get enough in your diet or if you have a disease that makes it hard to absorb vitamin B12. This test measures the amount of vitamin B12 and folate in your blood. A blood sample is required for the  test. Talk with your health care provider about what your results mean. This information is not intended to replace advice given to you by your health care provider. Make sure you discuss any questions you have with your health care provider. Document Revised: 06/09/2021 Document Reviewed: 06/09/2021 Elsevier Patient Education  2022 [employer].

## 2021-12-16 NOTE — Progress Notes (Signed)
Pt declined to stay for 30 min observation post iron. Discharged in stable condition, no complaints.

## 2021-12-22 MED FILL — Iron Sucrose Inj 20 MG/ML (Fe Equiv): INTRAVENOUS | Qty: 10 | Status: AC

## 2021-12-23 ENCOUNTER — Inpatient Hospital Stay: Payer: Managed Care, Other (non HMO)

## 2021-12-23 ENCOUNTER — Other Ambulatory Visit: Payer: Self-pay

## 2021-12-23 VITALS — BP 120/87 | HR 81 | Temp 97.9°F | Resp 18

## 2021-12-23 DIAGNOSIS — Z86718 Personal history of other venous thrombosis and embolism: Secondary | ICD-10-CM | POA: Diagnosis not present

## 2021-12-23 MED ORDER — SODIUM CHLORIDE 0.9 % IV SOLN: INTRAVENOUS | Status: DC

## 2021-12-23 MED ORDER — SODIUM CHLORIDE 0.9 % IV SOLN
200.0000 mg | Freq: Once | INTRAVENOUS | Status: AC
  Administered 2021-12-23: 200 mg via INTRAVENOUS
  Filled 2021-12-23: qty 200

## 2021-12-23 NOTE — Progress Notes (Signed)
Around 2 minutes into iron infusion patient reports she started feeling nauseated. Infusion was paused and patient was provided with an ice pack per request. Patient reported she did not eat and was very anxious about today's infusion due to the IV issues last time. Nutrition declined. She requested to continue, nausea was better. Iron was restarted and patient reported nausea returned slightly. Rate was reduced and nausea resolved.                      Patient tolerated remaining infusion of iron and VSS stable at discharge. She declined to stay for observation.

## 2021-12-29 MED FILL — Iron Sucrose Inj 20 MG/ML (Fe Equiv): INTRAVENOUS | Qty: 10 | Status: AC

## 2021-12-30 ENCOUNTER — Encounter: Payer: Self-pay | Admitting: Physician Assistant

## 2021-12-30 ENCOUNTER — Inpatient Hospital Stay: Payer: Managed Care, Other (non HMO)

## 2022-01-04 ENCOUNTER — Other Ambulatory Visit: Payer: Self-pay | Admitting: Hematology and Oncology

## 2022-01-12 ENCOUNTER — Inpatient Hospital Stay: Payer: Managed Care, Other (non HMO) | Attending: Physician Assistant

## 2022-02-02 ENCOUNTER — Encounter: Payer: Self-pay | Admitting: Physician Assistant

## 2022-02-09 ENCOUNTER — Inpatient Hospital Stay: Payer: Managed Care, Other (non HMO) | Attending: Physician Assistant

## 2022-02-12 ENCOUNTER — Telehealth: Payer: Self-pay | Admitting: Hematology and Oncology

## 2022-02-12 NOTE — Telephone Encounter (Signed)
R/s injection appt per 4/14 secure chat with RN. Attempted to call pt x2 but number is out of service and also attempted to call alternate number but no answer. Will mail calender ?

## 2022-03-09 ENCOUNTER — Inpatient Hospital Stay: Payer: Managed Care, Other (non HMO) | Attending: Physician Assistant

## 2022-03-09 ENCOUNTER — Telehealth: Payer: Self-pay

## 2022-03-09 ENCOUNTER — Other Ambulatory Visit: Payer: Self-pay | Admitting: Hematology and Oncology

## 2022-03-09 ENCOUNTER — Inpatient Hospital Stay: Payer: Managed Care, Other (non HMO) | Admitting: Hematology and Oncology

## 2022-03-09 ENCOUNTER — Inpatient Hospital Stay: Payer: Managed Care, Other (non HMO)

## 2022-03-09 DIAGNOSIS — I8290 Acute embolism and thrombosis of unspecified vein: Secondary | ICD-10-CM

## 2022-03-09 NOTE — Telephone Encounter (Signed)
Pt no showed for appointment today. Attempted to call pt but phone number listed for pt is out of service. Left HIPPA compliant voice mail on SO's VM requesting that pt call the clinic to update contact information and to reschedule missed appointments. Will send my chart message as well. ?

## 2022-03-23 ENCOUNTER — Encounter: Payer: Self-pay | Admitting: Physician Assistant

## 2022-09-25 IMAGING — CR DG CHEST 1V
1 series · 1 of 1 positions shown · non-contrast
Comparison: None.

CLINICAL DATA: Positive TB skin test

EXAM:
CHEST  1 VIEW

[w chest pa]
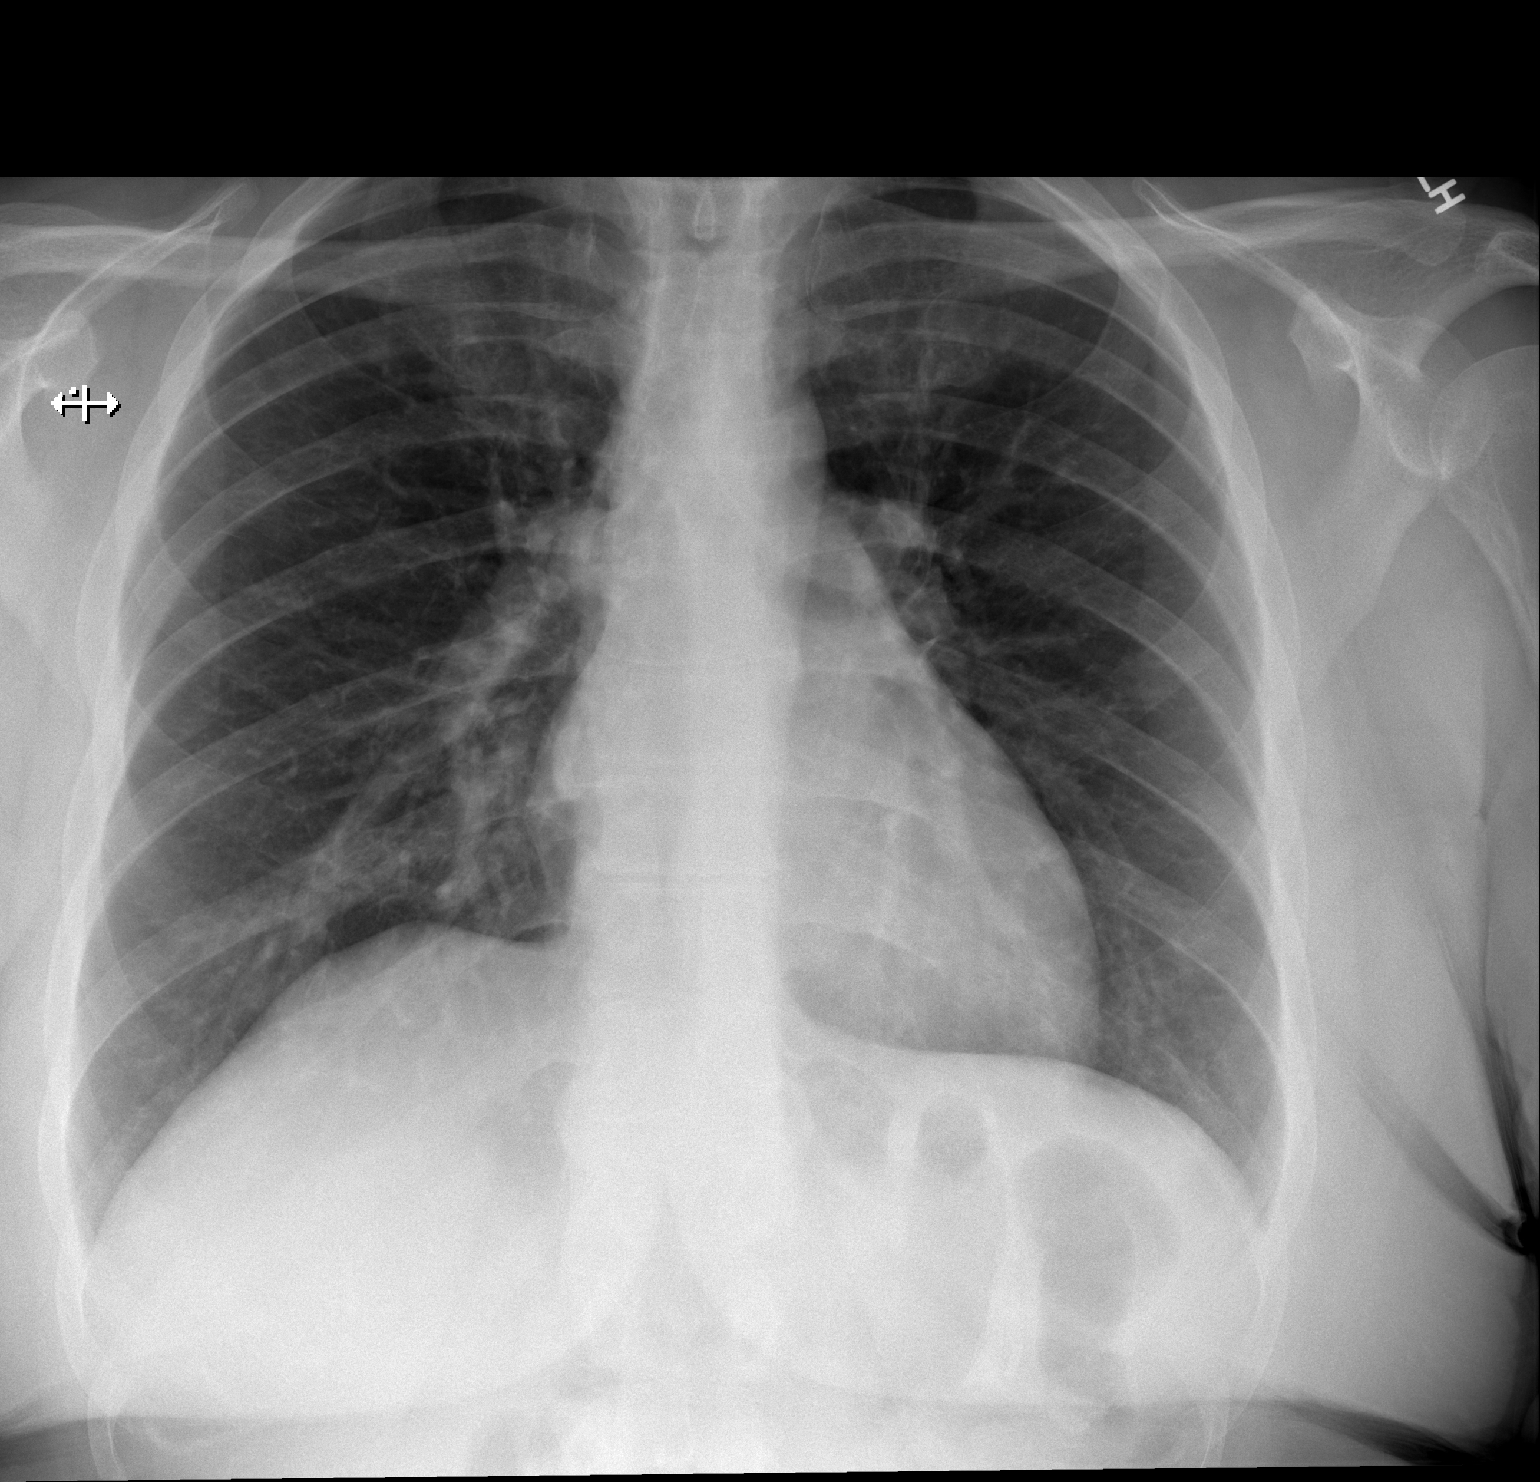

[1 of 1 positions shown; findings below may reference images not displayed]

FINDINGS: The heart size and mediastinal contours are within normal limits.
Both lungs are clear. The visualized skeletal structures are
unremarkable.
IMPRESSION: No active disease.

## 2023-10-13 IMAGING — CT CT ANGIO CHEST
2 of 7 series · 17 of 46 positions shown · IV contrast (omnipaque)
Comparison: Chest radiograph performed on the same date

CLINICAL DATA: Shortness of breath

EXAM:
CT ANGIOGRAPHY CHEST WITH CONTRAST
TECHNIQUE: Multidetector CT imaging of the chest was performed using the
standard protocol during bolus administration of intravenous
contrast. Multiplanar CT image reconstructions and MIPs were
obtained to evaluate the vascular anatomy.
CONTRAST:  85mL OMNIPAQUE IOHEXOL 350 MG/ML SOLN

[Series 5: pe axial thins · axial · 0.69mm/px · z∈[+1211,+1465]mm · 14 of 294 slices shown]
[im 20/294  lung]
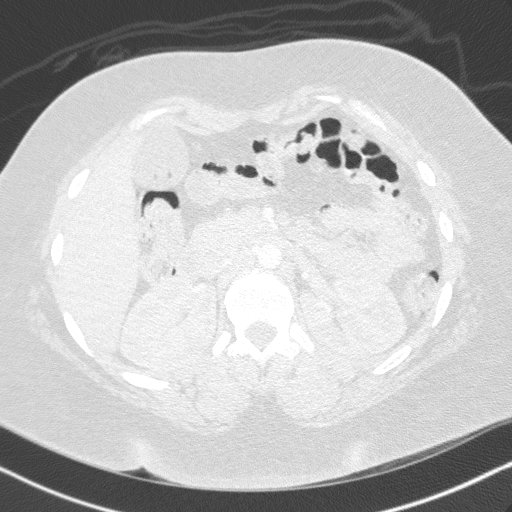
[im 40/294  soft-tissue]
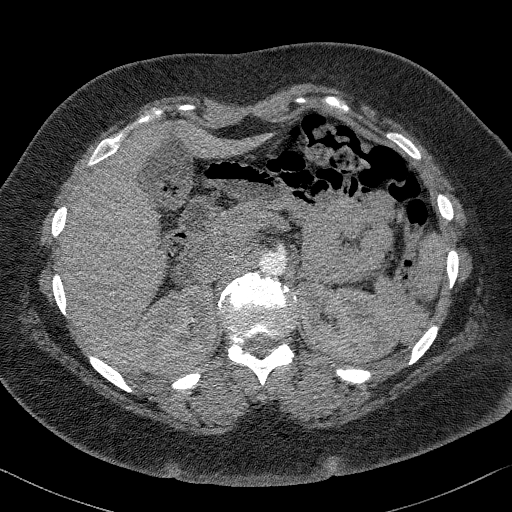
[im 59/294  lung]
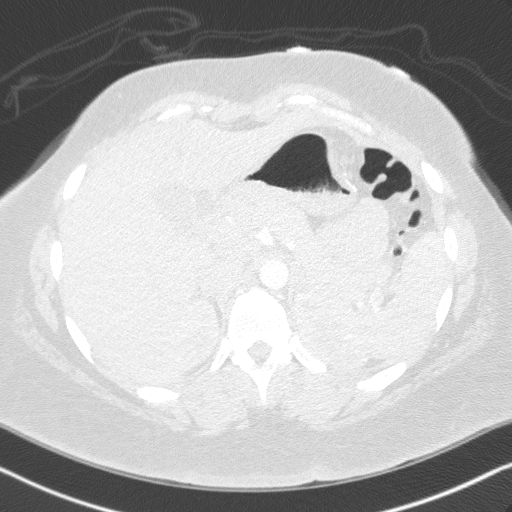
[im 79/294  soft-tissue]
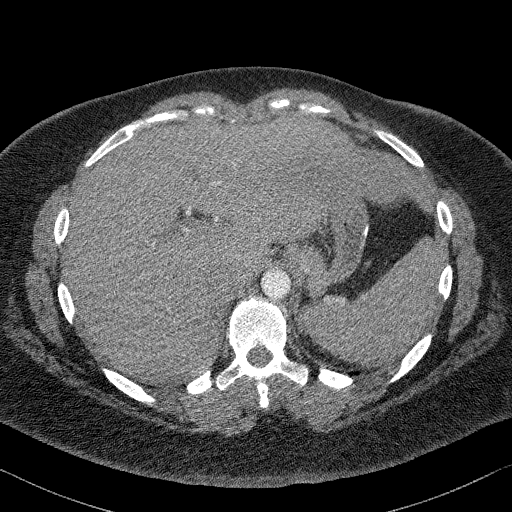
[im 98/294  lung]
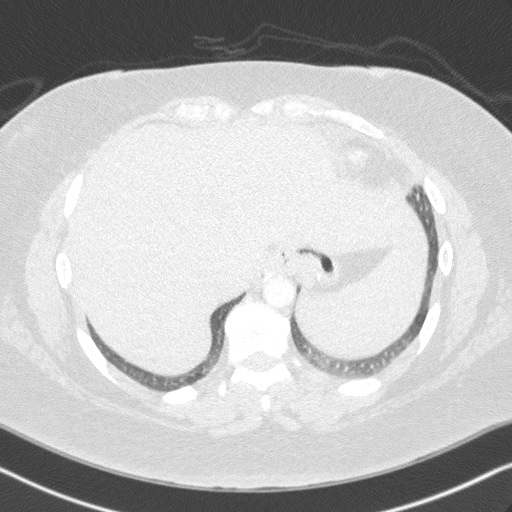
[im 118/294  soft-tissue]
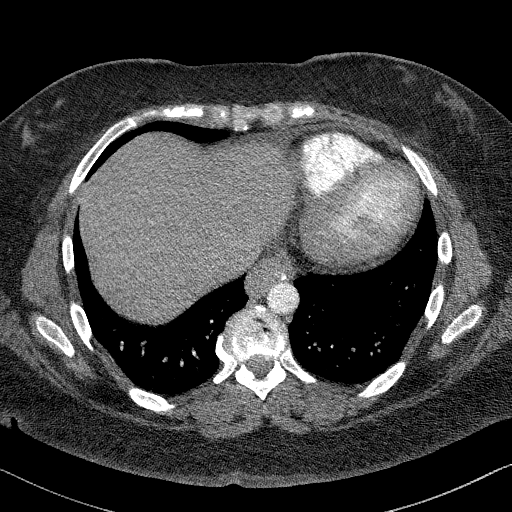
[im 137/294  lung]
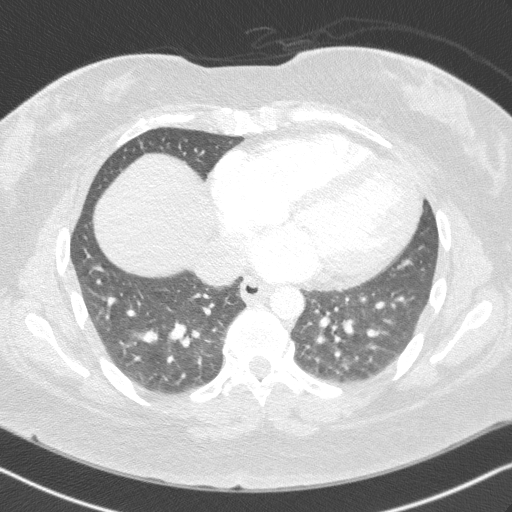
[im 157/294  soft-tissue]
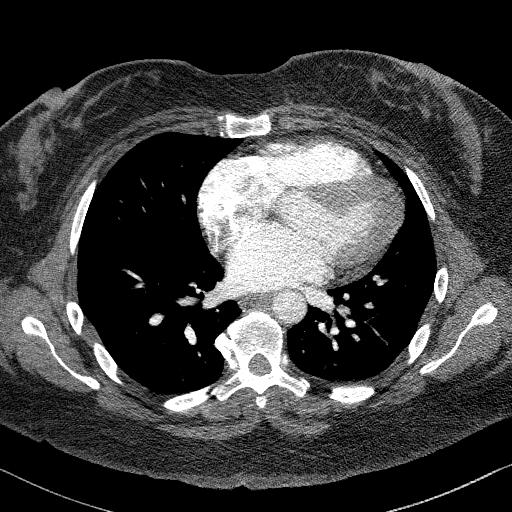
[im 176/294  lung]
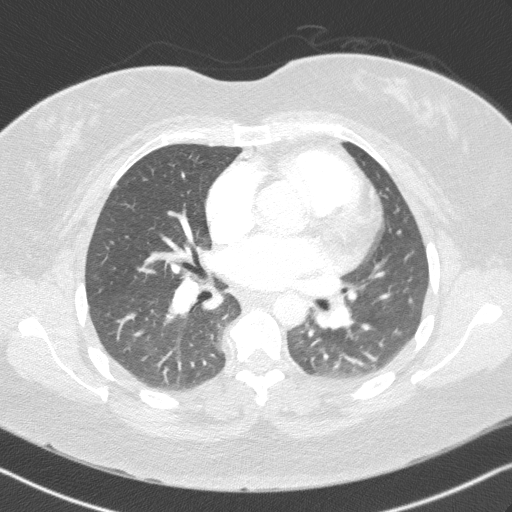
[im 196/294  soft-tissue]
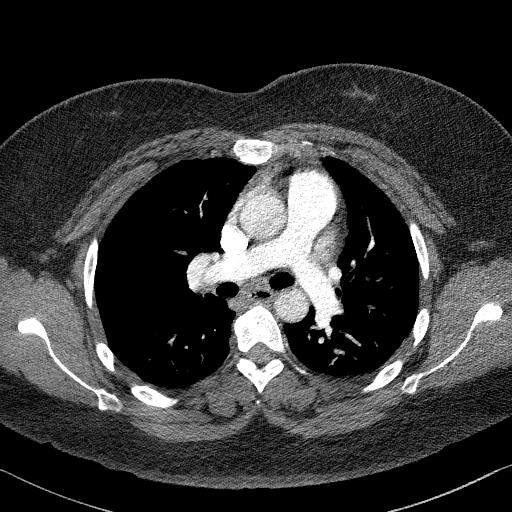
[im 215/294  lung]
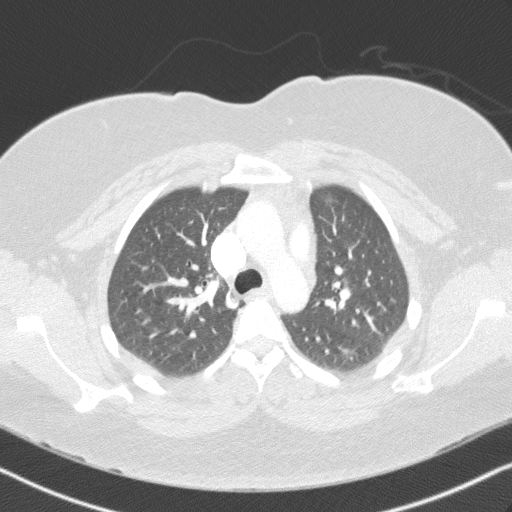
[im 235/294  soft-tissue]
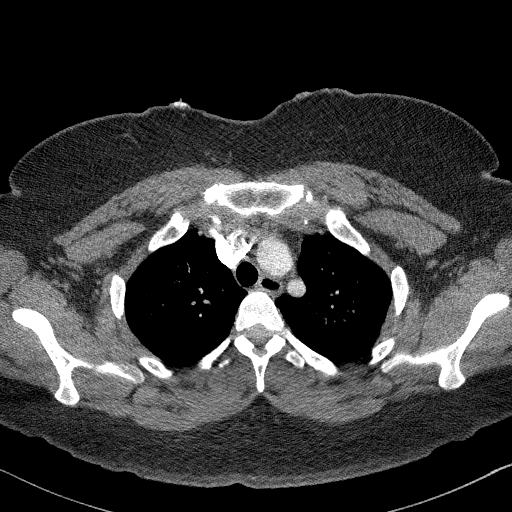
[im 254/294  lung]
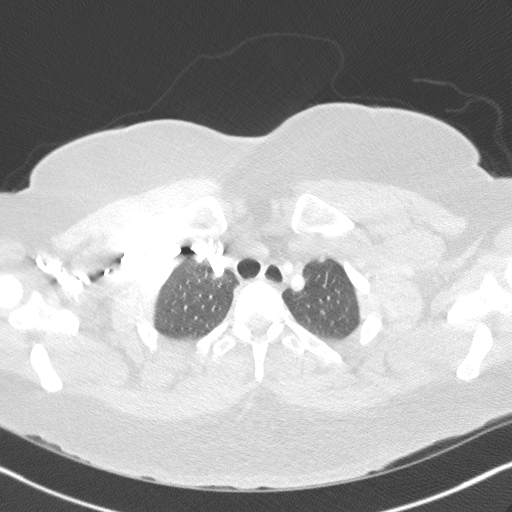
[im 274/294  soft-tissue]
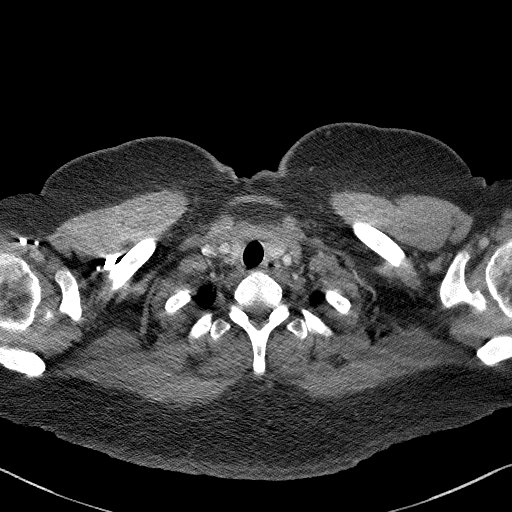

[Series 7: cor soft · coronal · 0.58mm/px · 3 of 147 slices shown]
[im 37/147  soft-tissue]
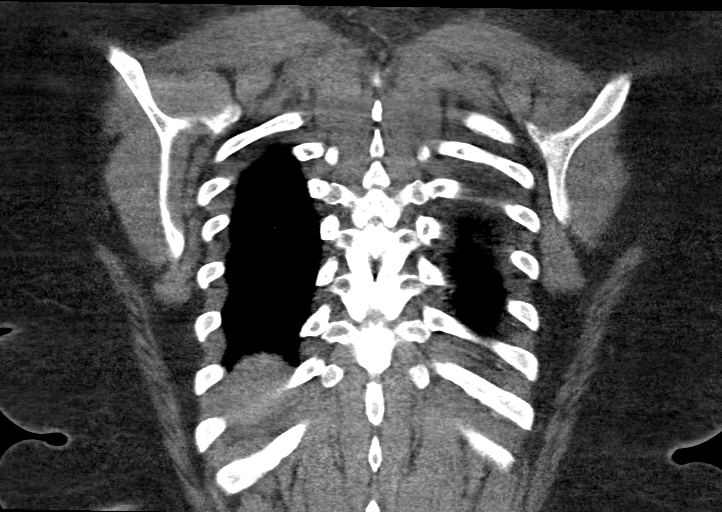
[im 74/147  soft-tissue]
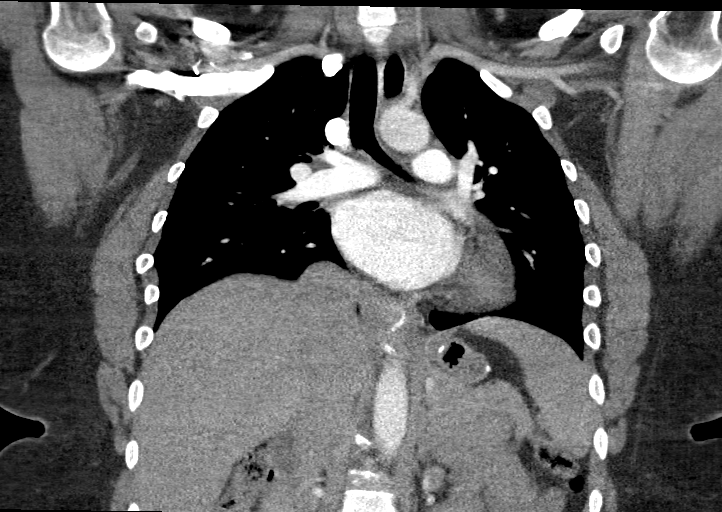
[im 110/147  soft-tissue]
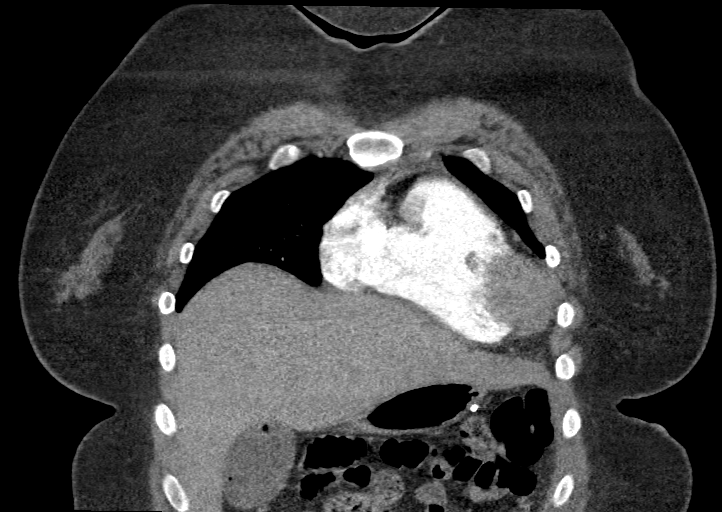

[17 of 46 positions shown; findings below may reference images not displayed]

FINDINGS: Cardiovascular: Satisfactory opacification of the pulmonary arteries
to the segmental level. No evidence of pulmonary embolism. Normal
heart size. No pericardial effusion.

Mediastinum/Nodes: No enlarged mediastinal, hilar, or axillary lymph
nodes. Thyroid gland, trachea, and esophagus demonstrate no
significant findings.

Lungs/Pleura: Lungs are clear. No pleural effusion or pneumothorax.

Upper Abdomen: Postsurgical changes about the stomach, likely
representing prior sleeve gastrectomy. No acute abnormality.

Musculoskeletal: No chest wall abnormality. No acute or significant
osseous findings.

Review of the MIP images confirms the above findings.
IMPRESSION: 1. No evidence of pulmonary embolism or acute cardiopulmonary
process.
2. Postsurgical changes for prior sleeve gastrectomy, no acute upper
abdominal process.

## 2023-10-13 IMAGING — DX DG CHEST 2V
2 series · 2 of 2 positions shown · non-contrast
Comparison: 11/22/2020

CLINICAL DATA: Chest tightness

EXAM:
CHEST - 2 VIEW

[chest pa]
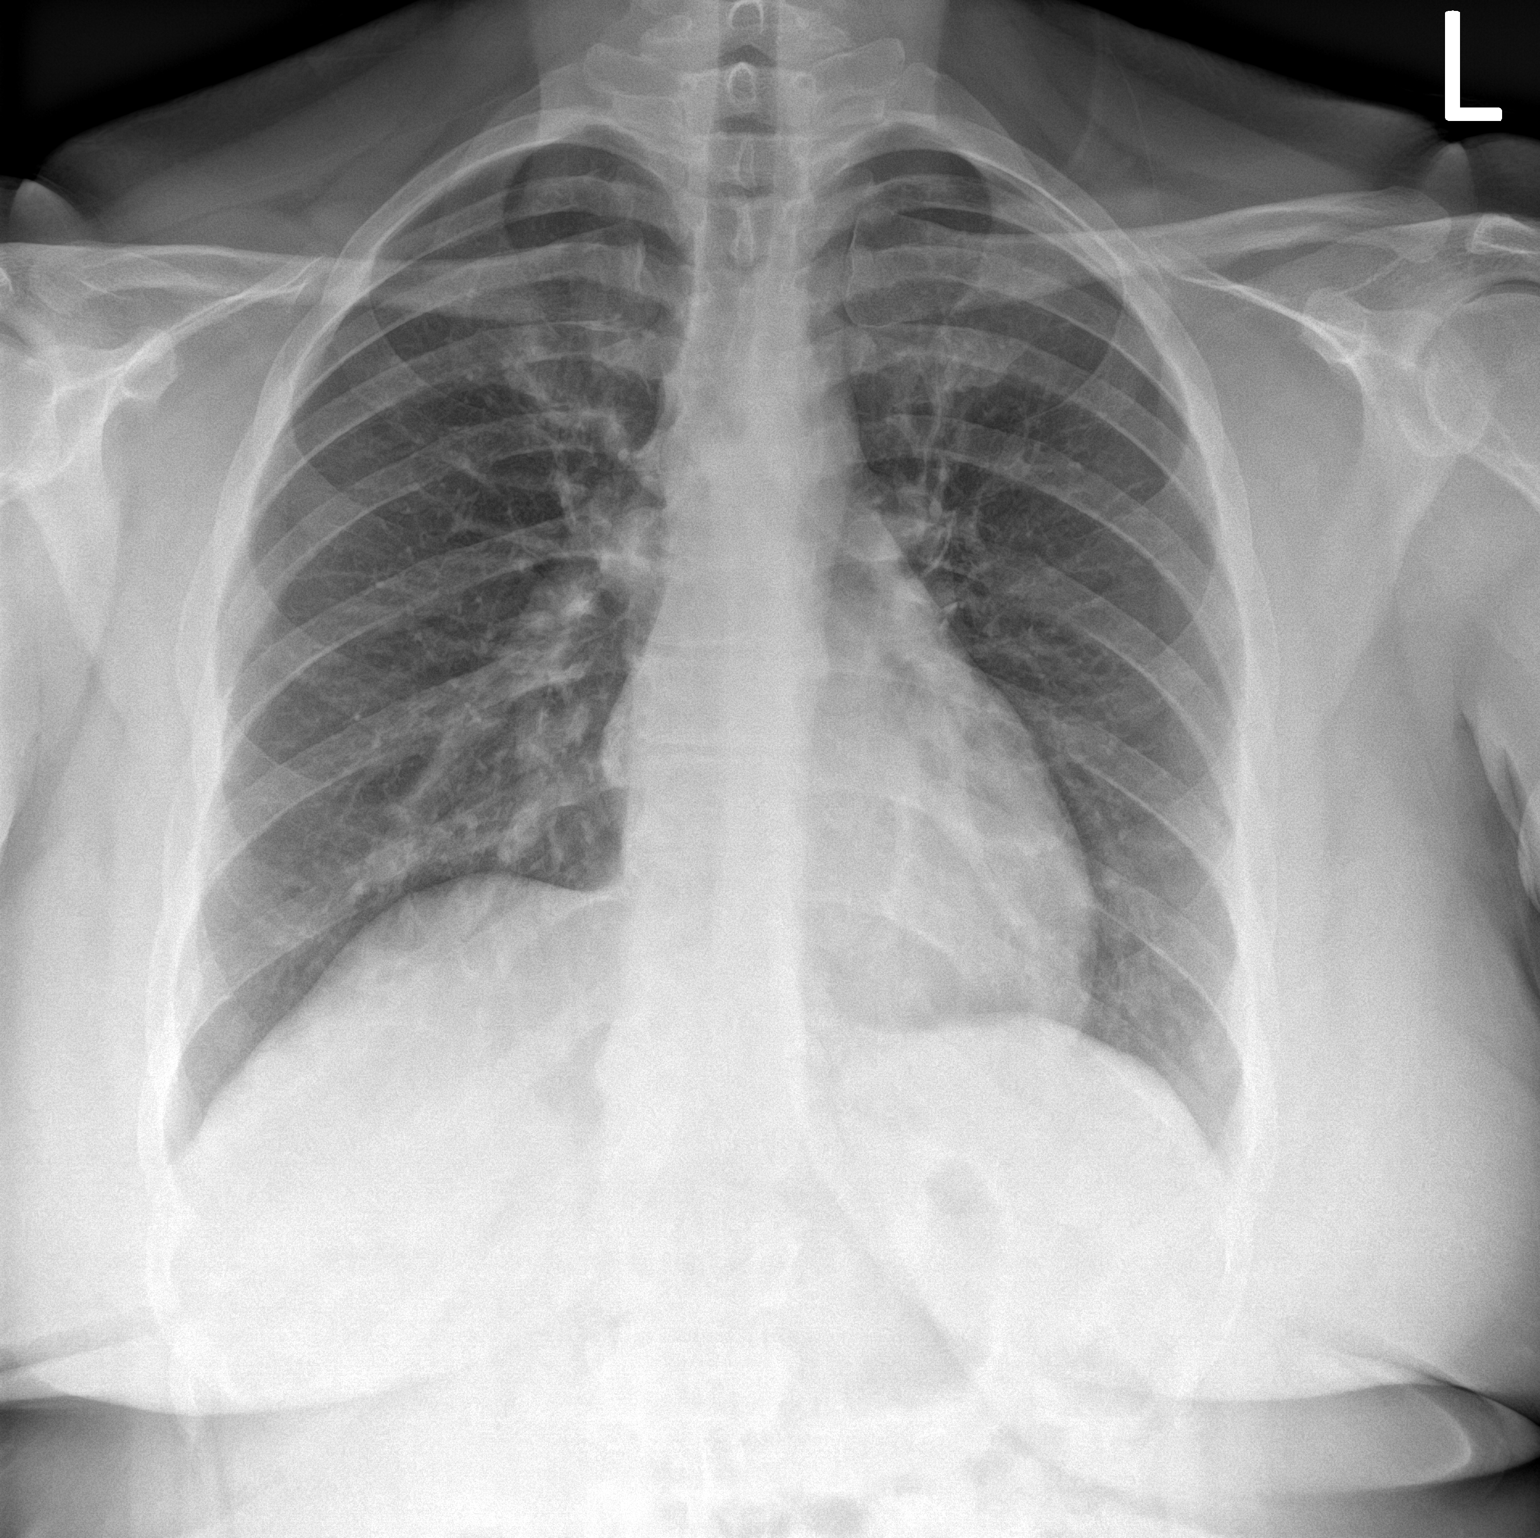

[chest lat]
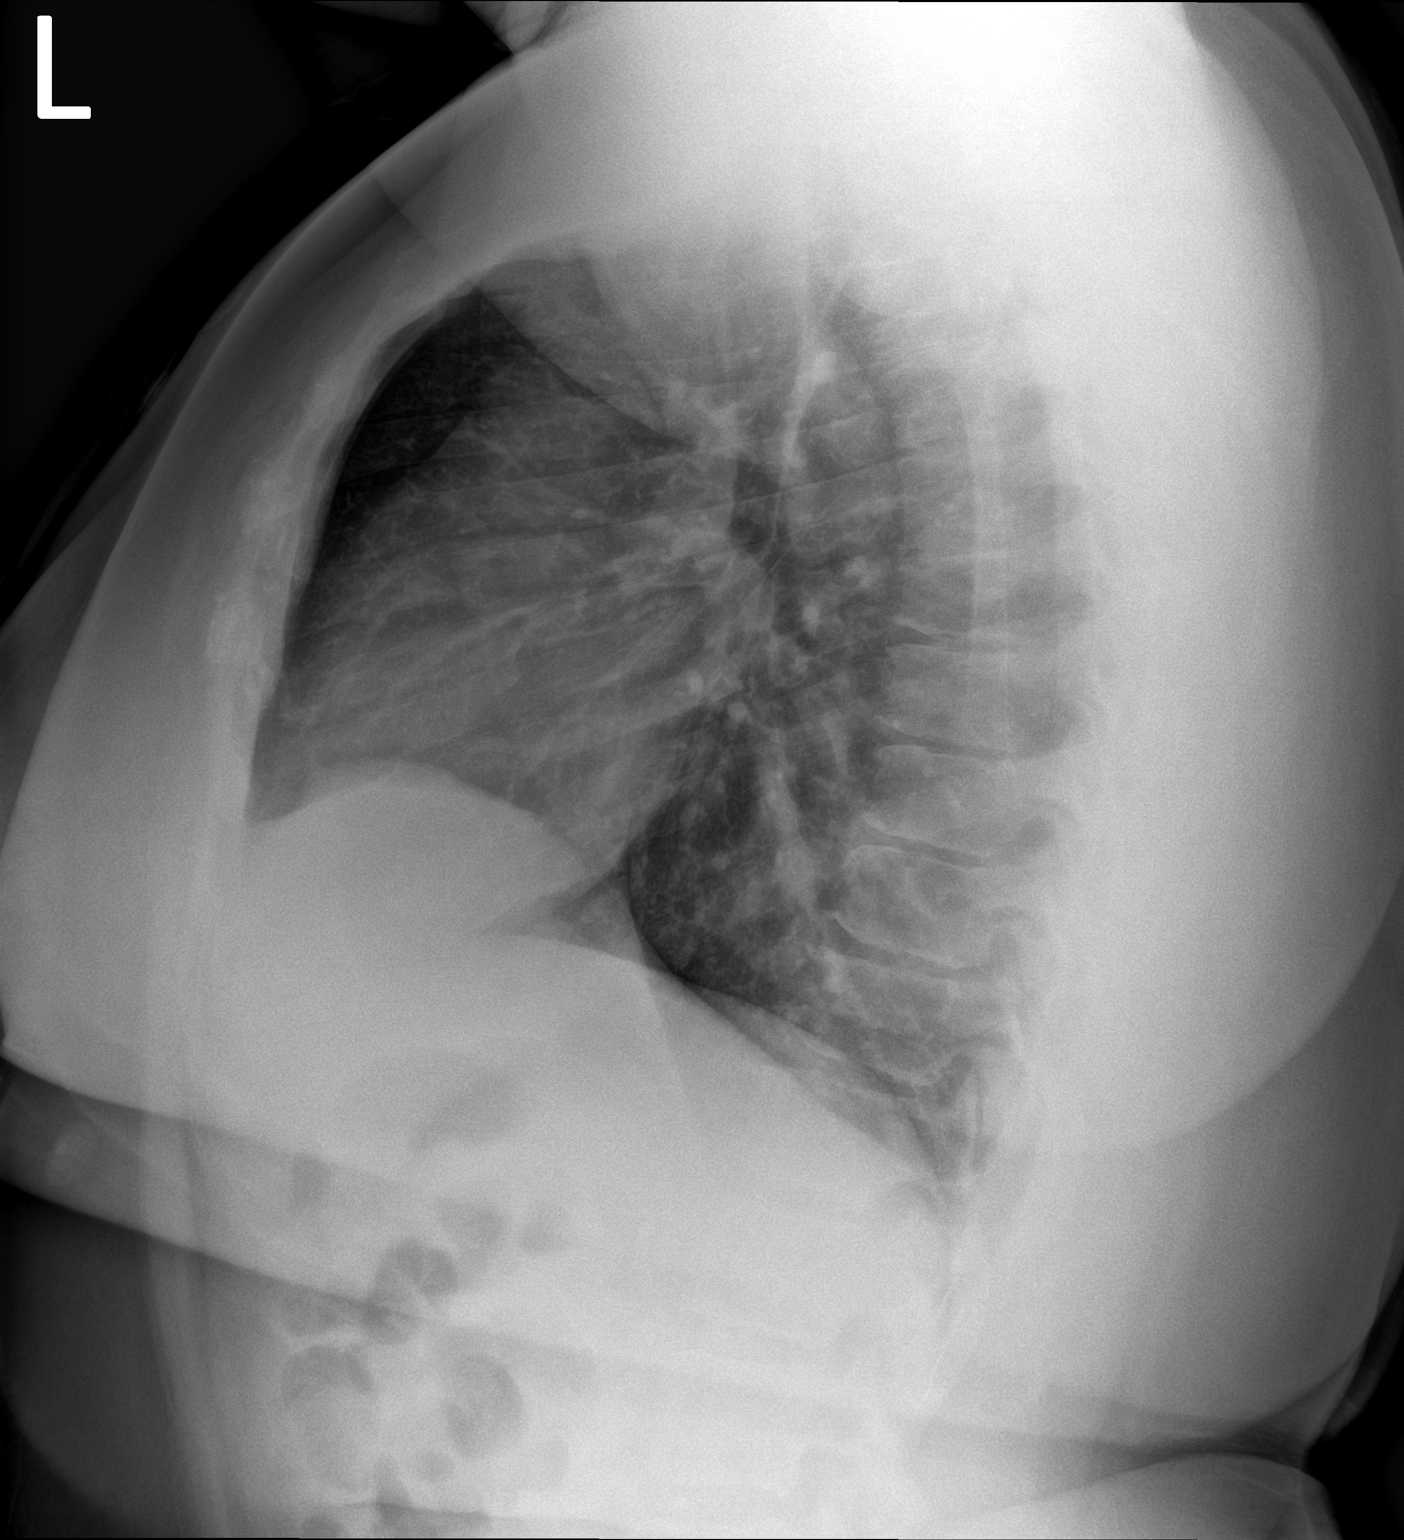

[2 of 2 positions shown; findings below may reference images not displayed]

FINDINGS: The heart size and mediastinal contours are within normal limits.
Both lungs are clear. The visualized skeletal structures are
unremarkable.
IMPRESSION: No active cardiopulmonary disease.
